# Patient Record
Sex: Female | Born: 1945 | Race: White | Hispanic: No | Marital: Married | Smoking: Former smoker
Health system: Southern US, Community
[De-identification: ages and names within clinical notes are randomized; demographics above are authoritative.]

## PROBLEM LIST (undated history)

## (undated) DIAGNOSIS — R42 Dizziness and giddiness: Secondary | ICD-10-CM

## (undated) DIAGNOSIS — T4145XA Adverse effect of unspecified anesthetic, initial encounter: Secondary | ICD-10-CM

## (undated) DIAGNOSIS — I4430 Unspecified atrioventricular block: Secondary | ICD-10-CM

## (undated) DIAGNOSIS — K5792 Diverticulitis of intestine, part unspecified, without perforation or abscess without bleeding: Secondary | ICD-10-CM

## (undated) DIAGNOSIS — R112 Nausea with vomiting, unspecified: Secondary | ICD-10-CM

## (undated) DIAGNOSIS — M1990 Unspecified osteoarthritis, unspecified site: Secondary | ICD-10-CM

## (undated) DIAGNOSIS — E041 Nontoxic single thyroid nodule: Secondary | ICD-10-CM

## (undated) DIAGNOSIS — Z9889 Other specified postprocedural states: Secondary | ICD-10-CM

## (undated) DIAGNOSIS — E785 Hyperlipidemia, unspecified: Secondary | ICD-10-CM

## (undated) DIAGNOSIS — T8859XA Other complications of anesthesia, initial encounter: Secondary | ICD-10-CM

## (undated) DIAGNOSIS — K219 Gastro-esophageal reflux disease without esophagitis: Secondary | ICD-10-CM

## (undated) HISTORY — PX: TONSILLECTOMY: SUR1361

## (undated) HISTORY — PX: FRACTURE SURGERY: SHX138

## (undated) HISTORY — PX: ADENOIDECTOMY: SUR15

## (undated) HISTORY — PX: OTHER SURGICAL HISTORY: SHX169

## (undated) HISTORY — PX: BREAST CYST ASPIRATION: SHX578

## (undated) HISTORY — PX: HERNIA REPAIR: SHX51

## (undated) HISTORY — PX: TONSILECTOMY, ADENOIDECTOMY, BILATERAL MYRINGOTOMY AND TUBES: SHX2538

## (undated) HISTORY — PX: CATARACT EXTRACTION, BILATERAL: SHX1313

## (undated) HISTORY — PX: COLONOSCOPY: SHX174

---

## 2004-09-16 ENCOUNTER — Ambulatory Visit: Payer: Self-pay | Admitting: Internal Medicine

## 2004-11-21 ENCOUNTER — Ambulatory Visit: Payer: Self-pay | Admitting: Gastroenterology

## 2005-08-02 ENCOUNTER — Emergency Department: Payer: Self-pay | Admitting: Emergency Medicine

## 2005-10-13 ENCOUNTER — Ambulatory Visit: Payer: Self-pay | Admitting: Internal Medicine

## 2006-10-19 ENCOUNTER — Ambulatory Visit: Payer: Self-pay | Admitting: Internal Medicine

## 2007-10-25 ENCOUNTER — Ambulatory Visit: Payer: Self-pay | Admitting: Internal Medicine

## 2008-10-26 ENCOUNTER — Ambulatory Visit: Payer: Self-pay | Admitting: Internal Medicine

## 2009-10-29 ENCOUNTER — Ambulatory Visit: Payer: Self-pay | Admitting: Internal Medicine

## 2010-11-18 ENCOUNTER — Ambulatory Visit: Payer: Self-pay | Admitting: Internal Medicine

## 2011-09-25 ENCOUNTER — Ambulatory Visit: Payer: Self-pay | Admitting: Internal Medicine

## 2011-11-19 ENCOUNTER — Ambulatory Visit: Payer: Self-pay | Admitting: Internal Medicine

## 2011-11-27 ENCOUNTER — Ambulatory Visit: Payer: Self-pay | Admitting: Gastroenterology

## 2012-11-21 ENCOUNTER — Ambulatory Visit: Payer: Self-pay | Admitting: Internal Medicine

## 2013-12-12 ENCOUNTER — Ambulatory Visit: Payer: Self-pay | Admitting: Internal Medicine

## 2014-07-13 ENCOUNTER — Other Ambulatory Visit: Payer: Self-pay | Admitting: Internal Medicine

## 2014-07-13 DIAGNOSIS — Z1231 Encounter for screening mammogram for malignant neoplasm of breast: Secondary | ICD-10-CM

## 2014-12-14 ENCOUNTER — Other Ambulatory Visit: Payer: Self-pay | Admitting: Internal Medicine

## 2014-12-14 ENCOUNTER — Ambulatory Visit
Admission: RE | Admit: 2014-12-14 | Discharge: 2014-12-14 | Disposition: A | Discharge status: Home / Self Care | Payer: Medicare Other | Source: Ambulatory Visit | Attending: Internal Medicine | Admitting: Internal Medicine

## 2014-12-14 DIAGNOSIS — Z1231 Encounter for screening mammogram for malignant neoplasm of breast: Secondary | ICD-10-CM

## 2015-09-25 ENCOUNTER — Other Ambulatory Visit: Payer: Self-pay | Admitting: Internal Medicine

## 2015-09-25 DIAGNOSIS — Z1231 Encounter for screening mammogram for malignant neoplasm of breast: Secondary | ICD-10-CM

## 2015-12-18 ENCOUNTER — Encounter
Admission: RE | Admit: 2015-12-18 | Discharge: 2015-12-18 | Disposition: A | Discharge status: Home / Self Care | Payer: Medicare Other | Source: Ambulatory Visit | Attending: Orthopedic Surgery | Admitting: Orthopedic Surgery

## 2015-12-18 HISTORY — DX: Gastro-esophageal reflux disease without esophagitis: K21.9

## 2015-12-18 HISTORY — DX: Other complications of anesthesia, initial encounter: T88.59XA

## 2015-12-18 HISTORY — DX: Unspecified osteoarthritis, unspecified site: M19.90

## 2015-12-18 HISTORY — DX: Adverse effect of unspecified anesthetic, initial encounter: T41.45XA

## 2015-12-18 MED ORDER — CEFAZOLIN SODIUM-DEXTROSE 2-4 GM/100ML-% IV SOLN
2.0000 g | Freq: Once | INTRAVENOUS | Status: AC
  Administered 2015-12-19: 2 g via INTRAVENOUS

## 2015-12-18 NOTE — Patient Instructions (Signed)
  Your procedure is scheduled on: 12-19-15 Report to Same Day Surgery 2nd floor medical mall @ 3:30 PER PT T  Remember: Instructions that are not followed completely may result in serious medical risk, up to and including death, or upon the discretion of your surgeon and anesthesiologist your surgery may need to be rescheduled.    _x___ 1. Do not eat food or drink liquids after midnight. No gum chewing or hard candies-PT STATES SHE WAS INSTRUCTED TO EAT A SMALL BREAKFAST (TOAST) AND CLEAR LIQUIDS AND HAVE IT ALL IN BY 7:30 AM     __x__ 2. No Alcohol for 24 hours before or after surgery.   __x__3. No Smoking for 24 prior to surgery.   ____  4. Bring all medications with you on the day of surgery if instructed.    __x__ 5. Notify your doctor if there is any change in your medical condition     (cold, fever, infections).     Do not wear jewelry, make-up, hairpins, clips or nail polish.  Do not wear lotions, powders, or perfumes. You may wear deodorant.  Do not shave 48 hours prior to surgery. Men may shave face and neck.  Do not bring valuables to the hospital.    [facility] is not responsible for any belongings or valuables.               Contacts, dentures or bridgework may not be worn into surgery.  Leave your suitcase in the car. After surgery it may be brought to your room.  For patients admitted to the hospital, discharge time is determined by your treatment team.   Patients discharged the day of surgery will not be allowed to drive home.    Please read over the following fact sheets that you were given:   [facility] Preparing for Surgery and or MRSA Information   _x___ Take these medicines the morning of surgery with A SIP OF WATER:    1. OMEPRAZOLE  2. TAKE AN OMEPRAZOLE TONIGHT BEFORE BED  3. MAY TAKE TYLENOL IF NEEDED  4.  5.  6.  ____Fleets enema or Magnesium Citrate as directed.   _x___ Use CHG Soap or sage wipes as directed on instruction sheet   ____ Use  inhalers on the day of surgery and bring to hospital day of surgery  ____ Stop metformin 2 days prior to surgery    ____ Take 1/2 of usual insulin dose the night before surgery and none on the morning of surgery.   _X___ Stop aspirin or coumadin, or plavix-LAST DOSE OF ASA WAS LUNCHTIME TODAY (12-18-15)  x__ Stop Anti-inflammatories such as Advil, Aleve, Ibuprofen, Motrin, Naproxen,          Naprosyn, Goodies powders or aspirin products. Ok to take Tylenol.   ____ Stop supplements until after surgery.    ____ Bring C-Pap to the hospital.

## 2015-12-19 ENCOUNTER — Ambulatory Visit: Payer: Medicare Other | Admitting: Certified Registered"

## 2015-12-19 ENCOUNTER — Ambulatory Visit: Payer: Medicare Other

## 2015-12-19 ENCOUNTER — Ambulatory Visit
Admission: RE | Admit: 2015-12-19 | Discharge: 2015-12-19 | Disposition: A | Discharge status: Home / Self Care | Payer: Medicare Other | Source: Ambulatory Visit | Attending: Orthopedic Surgery | Admitting: Orthopedic Surgery

## 2015-12-19 ENCOUNTER — Encounter: Payer: Self-pay | Admitting: *Deleted

## 2015-12-19 ENCOUNTER — Encounter
Admission: RE | Disposition: A | Discharge status: Home / Self Care | Payer: Self-pay | Source: Ambulatory Visit | Attending: Orthopedic Surgery

## 2015-12-19 DIAGNOSIS — M1990 Unspecified osteoarthritis, unspecified site: Secondary | ICD-10-CM | POA: Diagnosis not present

## 2015-12-19 DIAGNOSIS — Z87891 Personal history of nicotine dependence: Secondary | ICD-10-CM | POA: Diagnosis not present

## 2015-12-19 DIAGNOSIS — Z79899 Other long term (current) drug therapy: Secondary | ICD-10-CM | POA: Diagnosis not present

## 2015-12-19 DIAGNOSIS — S52611A Displaced fracture of right ulna styloid process, initial encounter for closed fracture: Secondary | ICD-10-CM | POA: Diagnosis not present

## 2015-12-19 DIAGNOSIS — S52501A Unspecified fracture of the lower end of right radius, initial encounter for closed fracture: Secondary | ICD-10-CM | POA: Diagnosis present

## 2015-12-19 DIAGNOSIS — Z823 Family history of stroke: Secondary | ICD-10-CM | POA: Insufficient documentation

## 2015-12-19 DIAGNOSIS — K219 Gastro-esophageal reflux disease without esophagitis: Secondary | ICD-10-CM | POA: Diagnosis not present

## 2015-12-19 DIAGNOSIS — E785 Hyperlipidemia, unspecified: Secondary | ICD-10-CM | POA: Diagnosis not present

## 2015-12-19 DIAGNOSIS — Z8781 Personal history of (healed) traumatic fracture: Secondary | ICD-10-CM

## 2015-12-19 DIAGNOSIS — Z800 Family history of malignant neoplasm of digestive organs: Secondary | ICD-10-CM | POA: Insufficient documentation

## 2015-12-19 DIAGNOSIS — W19XXXA Unspecified fall, initial encounter: Secondary | ICD-10-CM | POA: Insufficient documentation

## 2015-12-19 DIAGNOSIS — Z9889 Other specified postprocedural states: Secondary | ICD-10-CM

## 2015-12-19 HISTORY — PX: OPEN REDUCTION INTERNAL FIXATION (ORIF) DISTAL RADIAL FRACTURE: SHX5989

## 2015-12-19 SURGERY — OPEN REDUCTION INTERNAL FIXATION (ORIF) DISTAL RADIUS FRACTURE
Anesthesia: General | Site: Wrist | Laterality: Right | Wound class: Clean

## 2015-12-19 MED ORDER — LACTATED RINGERS IV SOLN
INTRAVENOUS | Status: DC
  Administered 2015-12-19: 16:00:00 via INTRAVENOUS

## 2015-12-19 MED ORDER — FENTANYL CITRATE (PF) 100 MCG/2ML IJ SOLN
INTRAMUSCULAR | Status: DC | PRN
  Administered 2015-12-19: 25 ug via INTRAVENOUS
  Administered 2015-12-19: 50 ug via INTRAVENOUS
  Administered 2015-12-19 (×3): 25 ug via INTRAVENOUS

## 2015-12-19 MED ORDER — CEFAZOLIN SODIUM-DEXTROSE 2-4 GM/100ML-% IV SOLN
INTRAVENOUS | Status: AC
  Filled 2015-12-19: qty 100

## 2015-12-19 MED ORDER — OXYCODONE HCL 5 MG PO TABS
5.0000 mg | ORAL_TABLET | Freq: Once | ORAL | Status: AC | PRN
  Administered 2015-12-19: 5 mg via ORAL

## 2015-12-19 MED ORDER — OXYCODONE HCL 5 MG PO TABS
ORAL_TABLET | ORAL | Status: AC
  Filled 2015-12-19: qty 1

## 2015-12-19 MED ORDER — PROPOFOL 10 MG/ML IV BOLUS
INTRAVENOUS | Status: DC | PRN
  Administered 2015-12-19: 150 mg via INTRAVENOUS

## 2015-12-19 MED ORDER — FENTANYL CITRATE (PF) 100 MCG/2ML IJ SOLN
25.0000 ug | INTRAMUSCULAR | Status: DC | PRN
  Administered 2015-12-19 (×3): 50 ug via INTRAVENOUS

## 2015-12-19 MED ORDER — EPHEDRINE SULFATE 50 MG/ML IJ SOLN
INTRAMUSCULAR | Status: DC | PRN
  Administered 2015-12-19: 10 mg via INTRAVENOUS

## 2015-12-19 MED ORDER — SCOPOLAMINE 1 MG/3DAYS TD PT72
1.0000 | MEDICATED_PATCH | TRANSDERMAL | Status: DC
  Administered 2015-12-19: 1.5 mg via TRANSDERMAL

## 2015-12-19 MED ORDER — NEOMYCIN-POLYMYXIN B GU 40-200000 IR SOLN
Status: AC
  Filled 2015-12-19: qty 2

## 2015-12-19 MED ORDER — GLYCOPYRROLATE 0.2 MG/ML IJ SOLN
INTRAMUSCULAR | Status: DC | PRN
  Administered 2015-12-19: 0.2 mg via INTRAVENOUS

## 2015-12-19 MED ORDER — FENTANYL CITRATE (PF) 100 MCG/2ML IJ SOLN
INTRAMUSCULAR | Status: AC
  Filled 2015-12-19: qty 2

## 2015-12-19 MED ORDER — NEOMYCIN-POLYMYXIN B GU 40-200000 IR SOLN
Status: DC | PRN
  Administered 2015-12-19: 2 mL

## 2015-12-19 MED ORDER — DEXAMETHASONE SODIUM PHOSPHATE 10 MG/ML IJ SOLN
INTRAMUSCULAR | Status: DC | PRN
  Administered 2015-12-19: 5 mg via INTRAVENOUS

## 2015-12-19 MED ORDER — LIDOCAINE HCL (PF) 2 % IJ SOLN
INTRAMUSCULAR | Status: DC | PRN
  Administered 2015-12-19: 50 mg

## 2015-12-19 MED ORDER — OXYCODONE HCL 5 MG/5ML PO SOLN: 5.0000 mg | Freq: Once | ORAL | Status: AC | PRN

## 2015-12-19 MED ORDER — ONDANSETRON HCL 4 MG/2ML IJ SOLN
INTRAMUSCULAR | Status: DC | PRN
  Administered 2015-12-19: 4 mg via INTRAVENOUS

## 2015-12-19 MED ORDER — OXYCODONE-ACETAMINOPHEN 5-325 MG PO TABS: 1.0000 | ORAL_TABLET | ORAL | 0 refills | Status: DC | PRN

## 2015-12-19 MED ORDER — SCOPOLAMINE 1 MG/3DAYS TD PT72
MEDICATED_PATCH | TRANSDERMAL | Status: AC
  Filled 2015-12-19: qty 1

## 2015-12-19 MED ORDER — MIDAZOLAM HCL 5 MG/5ML IJ SOLN
INTRAMUSCULAR | Status: DC | PRN
  Administered 2015-12-19: 2 mg via INTRAVENOUS

## 2015-12-19 SURGICAL SUPPLY — 43 items
BANDAGE ACE 4X5 VEL STRL LF (GAUZE/BANDAGES/DRESSINGS) ×3 IMPLANT
BIT DRILL 2 FAST STEP (BIT) ×3 IMPLANT
BIT DRILL 2.8X4 QC CORT (BIT) ×3 IMPLANT
CANISTER SUCT 1200ML W/VALVE (MISCELLANEOUS) ×3 IMPLANT
CHLORAPREP W/TINT 26ML (MISCELLANEOUS) ×3 IMPLANT
CUFF TOURN 18 STER (MISCELLANEOUS) IMPLANT
DRAPE FLUOR MINI C-ARM 54X84 (DRAPES) ×3 IMPLANT
ELECT REM PT RETURN 9FT ADLT (ELECTROSURGICAL) ×3
ELECTRODE REM PT RTRN 9FT ADLT (ELECTROSURGICAL) ×1 IMPLANT
GAUZE PETRO XEROFOAM 1X8 (MISCELLANEOUS) ×6 IMPLANT
GAUZE SPONGE 4X4 12PLY STRL (GAUZE/BANDAGES/DRESSINGS) ×3 IMPLANT
GLOVE BIOGEL PI IND STRL 9 (GLOVE) ×1 IMPLANT
GLOVE BIOGEL PI INDICATOR 9 (GLOVE) ×2
GLOVE SURG SYN 9.0  PF PI (GLOVE) ×2
GLOVE SURG SYN 9.0 PF PI (GLOVE) ×1 IMPLANT
GOWN SRG 2XL LVL 4 RGLN SLV (GOWNS) ×1 IMPLANT
GOWN STRL NON-REIN 2XL LVL4 (GOWNS) ×2
GOWN STRL REUS W/ TWL LRG LVL3 (GOWN DISPOSABLE) ×1 IMPLANT
GOWN STRL REUS W/TWL LRG LVL3 (GOWN DISPOSABLE) ×2
K-WIRE 1.6 (WIRE) ×2
K-WIRE FX5X1.6XNS BN SS (WIRE) ×1
KIT RM TURNOVER STRD PROC AR (KITS) ×3 IMPLANT
KWIRE FX5X1.6XNS BN SS (WIRE) ×1 IMPLANT
NEEDLE FILTER BLUNT 18X 1/2SAF (NEEDLE) ×2
NEEDLE FILTER BLUNT 18X1 1/2 (NEEDLE) ×1 IMPLANT
NS IRRIG 500ML POUR BTL (IV SOLUTION) ×3 IMPLANT
PACK EXTREMITY ARMC (MISCELLANEOUS) ×3 IMPLANT
PAD CAST CTTN 4X4 STRL (SOFTGOODS) ×2 IMPLANT
PADDING CAST COTTON 4X4 STRL (SOFTGOODS) ×4
PEG SUBCHONDRAL SMOOTH 2.0X16 (Peg) ×3 IMPLANT
PEG SUBCHONDRAL SMOOTH 2.0X18 (Peg) ×3 IMPLANT
PEG SUBCHONDRAL SMOOTH 2.0X22 (Peg) ×3 IMPLANT
PLATE SHORT 21.6X48.9 NRRW RT (Plate) ×3 IMPLANT
SCREW CORT 3.5X10 LNG (Screw) ×3 IMPLANT
SCREW MULTI DIRECT 18MM (Screw) ×3 IMPLANT
SCREW MULTI DIRECT 20MM (Screw) ×6 IMPLANT
SPLINT CAST 1 STEP 3X12 (MISCELLANEOUS) ×3 IMPLANT
STOCKINETTE STRL 4IN 9604848 (GAUZE/BANDAGES/DRESSINGS) ×3 IMPLANT
SUT ETHILON 4-0 (SUTURE) ×2
SUT ETHILON 4-0 FS2 18XMFL BLK (SUTURE) ×1
SUT VICRYL 3-0 27IN (SUTURE) ×3 IMPLANT
SUTURE ETHLN 4-0 FS2 18XMF BLK (SUTURE) ×1 IMPLANT
SYR 3ML LL SCALE MARK (SYRINGE) ×3 IMPLANT

## 2015-12-19 NOTE — OR Nursing (Signed)
EKG done preop and reviewed by Dr. Amie Critchley NPO status - toast/black coffee 7am  - Dr. Amie Critchley aware Scop patch applied as ordered

## 2015-12-19 NOTE — Transfer of Care (Signed)
Immediate Anesthesia Transfer of Care Note  Patient: Natalie Wood  Procedure(s) Performed: Procedure(s): OPEN REDUCTION INTERNAL FIXATION (ORIF) DISTAL RADIAL FRACTURE (Right)  Patient Location: PACU  Anesthesia Type:General  Level of Consciousness: awake, alert  and oriented  Airway & Oxygen Therapy: Patient Spontanous Breathing and Patient connected to face mask oxygen  Post-op Assessment: Report given to RN and Post -op Vital signs reviewed and stable  Post vital signs: Reviewed  Last Vitals:  Vitals:   12/19/15 1506 12/19/15 1733  BP: (!) 164/68 (!) 155/66  Pulse: 65 91  Resp: 18 14  Temp: 36.9 C (!) 35.9 C    Last Pain:  Vitals:   12/19/15 1506  TempSrc: Oral  PainSc: 3          Complications: No apparent anesthesia complications

## 2015-12-19 NOTE — H&P (Signed)
Reviewed paper H+P, will be scanned into chart. No changes noted.  

## 2015-12-19 NOTE — Anesthesia Procedure Notes (Signed)
Procedure Name: LMA Insertion Performed by: Rolla Plate Pre-anesthesia Checklist: Patient identified, Patient being monitored, Timeout performed, Emergency Drugs available and Suction available Patient Re-evaluated:Patient Re-evaluated prior to inductionOxygen Delivery Method: Circle system utilized Preoxygenation: Pre-oxygenation with 100% oxygen Intubation Type: IV induction LMA: LMA inserted LMA Size: 3.5 Tube type: Oral Number of attempts: 1 Placement Confirmation: positive ETCO2 and breath sounds checked- equal and bilateral Tube secured with: Tape Dental Injury: Teeth and Oropharynx as per pre-operative assessment

## 2015-12-19 NOTE — Anesthesia Preprocedure Evaluation (Addendum)
Anesthesia Evaluation  Patient identified by MRN, date of birth, ID band Patient awake    Reviewed: Allergy & Precautions, H&P , NPO status , Patient's Chart, lab work & pertinent test results  History of Anesthesia Complications (+) PONV, PROLONGED EMERGENCE and history of anesthetic complications  Airway Mallampati: III  TM Distance: <3 FB Neck ROM: limited    Dental  (+) Poor Dentition, Chipped   Pulmonary neg shortness of breath, former smoker,    Pulmonary exam normal breath sounds clear to auscultation       Cardiovascular Exercise Tolerance: Good (-) angina(-) Past MI and (-) DOE negative cardio ROS Normal cardiovascular exam Rhythm:regular Rate:Normal     Neuro/Psych negative neurological ROS  negative psych ROS   GI/Hepatic Neg liver ROS, GERD  Controlled,  Endo/Other  negative endocrine ROS  Renal/GU      Musculoskeletal  (+) Arthritis ,   Abdominal   Peds  Hematology negative hematology ROS (+)   Anesthesia Other Findings Signs and symptoms suggestive of sleep apnea   Past Medical History: No date: Anemia     Comment: H/O No date: Arthritis     Comment: LEFT KNEE No date: Complication of anesthesia     Comment: HARD TO WAKE UP AFTER A COLONOSCOPY-TENDS TO               GET NAUSEATED EASILY No date: GERD (gastroesophageal reflux disease)  Past Surgical History: No date: BREAST CYST ASPIRATION Bilateral     Comment: 35 years ago negative No date: COLONOSCOPY  BMI    Body Mass Index:  29.63 kg/m      Reproductive/Obstetrics negative OB ROS                             Anesthesia Physical Anesthesia Plan  ASA: III  Anesthesia Plan: General LMA   Post-op Pain Management:    Induction:   Airway Management Planned:   Additional Equipment:   Intra-op Plan:   Post-operative Plan:   Informed Consent: I have reviewed the patients History and Physical,  chart, labs and discussed the procedure including the risks, benefits and alternatives for the proposed anesthesia with the patient or authorized representative who has indicated his/her understanding and acceptance.   Dental Advisory Given  Plan Discussed with: Anesthesiologist, CRNA and Surgeon  Anesthesia Plan Comments:        Anesthesia Quick Evaluation

## 2015-12-19 NOTE — Anesthesia Postprocedure Evaluation (Signed)
Anesthesia Post Note  Patient: Natalie Wood  Procedure(s) Performed: Procedure(s) (LRB): OPEN REDUCTION INTERNAL FIXATION (ORIF) DISTAL RADIAL FRACTURE (Right)  Patient location during evaluation: PACU Anesthesia Type: General Level of consciousness: awake and alert and oriented Pain management: pain level controlled Vital Signs Assessment: post-procedure vital signs reviewed and stable Respiratory status: spontaneous breathing, nonlabored ventilation and respiratory function stable Cardiovascular status: blood pressure returned to baseline and stable Postop Assessment: no signs of nausea or vomiting Anesthetic complications: no    Last Vitals:  Vitals:   12/19/15 1734 12/19/15 1749  BP: (!) 155/66 (!) 147/68  Pulse: 84 78  Resp: 13 (!) 5  Temp: (!) 35.9 C     Last Pain:  Vitals:   12/19/15 1749  TempSrc:   PainSc: 3                  Ayani Ospina

## 2015-12-19 NOTE — Discharge Instructions (Signed)
Keep arm elevated is much as possible, keep splint and dressing clean and dry. Move fingers is much as possible. Pain medicine as directed   AMBULATORY SURGERY  DISCHARGE INSTRUCTIONS   1) The drugs that you were given will stay in your system until tomorrow so for the next 24 hours you should not:  A) Drive an automobile B) Make any legal decisions C) Drink any alcoholic beverage   2) You may resume regular meals tomorrow.  Today it is better to start with liquids and gradually work up to solid foods.  You may eat anything you prefer, but it is better to start with liquids, then soup and crackers, and gradually work up to solid foods.   3) Please notify your doctor immediately if you have any unusual bleeding, trouble breathing, redness and pain at the surgery site, drainage, fever, or pain not relieved by medication.    4) Additional Instructions:        Please contact your physician with any problems or Same Day Surgery at [phone], Monday through Friday 6 am to 4 pm, or [place] [facility] number at [phone].

## 2015-12-19 NOTE — Op Note (Signed)
12/19/2015  5:39 PM  PATIENT:  Natalie Wood  69 y.o. female  PRE-OPERATIVE DIAGNOSIS:  right distal radius fracture  POST-OPERATIVE DIAGNOSIS:  right distal radius fracture  PROCEDURE:  Procedure(s): OPEN REDUCTION INTERNAL FIXATION (ORIF) DISTAL RADIAL FRACTURE (Right)  SURGEON: Laurene Footman, MD  ASSISTANTS: None  ANESTHESIA:   general  EBL:  Total I/O In: 500 [I.V.:500] Out: -   BLOOD ADMINISTERED:none  DRAINS: none   LOCAL MEDICATIONS USED:  NONE  SPECIMEN:  No Specimen  DISPOSITION OF SPECIMEN:  N/A  COUNTS:  YES  TOURNIQUET:   27 minutes at 250 murmurs mercury  IMPLANTS: Short narrow DVR plate from [employer] with multiple screws and pegs  DICTATION: .Dragon Dictation patient brought to the operating room and after adequate general anesthesia was obtained the arm was prepped and draped in sterile fashion with a turned by the upper arm. After patient identification and timeout procedures were completed, the tourniquet was raised. A volar approach is made centered over the FCR tendon with the tendon retracted radially along the radial artery and vessels. The fingertrap traction was applied at the end of the table and this restored length. A freer elevator was used to get better alignment of the distal fragments with 2 intra-articular fragments being present these were reduced and with the wrist in flexion near anatomic alignment was obtained a volar plate was then pinned in place and the cortical screws were placed in the shaft with 310 mm screws placed. Distally threaded pegs were used and the proximal row and smooth pegs in the distal row to get good fixation of the distal radius with all threaded peg threaded peg smooth pegs and screws and traction was removed. Under fluoroscopic guidance the range of motion was noted and there was no motion at the fracture site. The wound was irrigated and tourniquet let down after tourniquet is let down the wound was  closed with simple interrupted 3-0 Vicryl and 4-0 nylon for the skin. Xeroform 4 x 4 web roll volar splint and Ace wrap applied and applied  PLAN OF CARE: Discharge to home after PACU  PATIENT DISPOSITION:  PACU - hemodynamically stable.

## 2015-12-20 ENCOUNTER — Encounter: Payer: Self-pay | Admitting: Orthopedic Surgery

## 2015-12-27 ENCOUNTER — Ambulatory Visit: Admission: RE | Admit: 2015-12-27 | Payer: Medicare Other | Source: Ambulatory Visit

## 2016-01-08 ENCOUNTER — Ambulatory Visit
Admission: RE | Admit: 2016-01-08 | Discharge: 2016-01-08 | Disposition: A | Discharge status: Home / Self Care | Payer: Medicare Other | Source: Ambulatory Visit | Attending: Internal Medicine | Admitting: Internal Medicine

## 2016-01-08 ENCOUNTER — Other Ambulatory Visit: Payer: Self-pay | Admitting: Internal Medicine

## 2016-01-08 DIAGNOSIS — Z1231 Encounter for screening mammogram for malignant neoplasm of breast: Secondary | ICD-10-CM | POA: Diagnosis not present

## 2016-01-17 ENCOUNTER — Other Ambulatory Visit: Payer: Self-pay | Admitting: Family Medicine

## 2016-01-17 MED ORDER — OXYCODONE-ACETAMINOPHEN 5-325 MG PO TABS: 1.0000 | ORAL_TABLET | ORAL | 0 refills | Status: DC | PRN

## 2016-01-17 NOTE — Telephone Encounter (Signed)
Pt needs refill on her hydrocodone.  Please call when ready.  Thanks Con Memos

## 2016-01-20 ENCOUNTER — Other Ambulatory Visit: Payer: Self-pay | Admitting: Family Medicine

## 2016-10-07 ENCOUNTER — Other Ambulatory Visit: Payer: Self-pay | Admitting: Internal Medicine

## 2016-10-07 DIAGNOSIS — Z1231 Encounter for screening mammogram for malignant neoplasm of breast: Secondary | ICD-10-CM

## 2017-01-08 ENCOUNTER — Ambulatory Visit
Admission: RE | Admit: 2017-01-08 | Discharge: 2017-01-08 | Disposition: A | Discharge status: Home / Self Care | Payer: Medicare Other | Source: Ambulatory Visit | Attending: Internal Medicine | Admitting: Internal Medicine

## 2017-01-08 DIAGNOSIS — Z1231 Encounter for screening mammogram for malignant neoplasm of breast: Secondary | ICD-10-CM | POA: Insufficient documentation

## 2017-12-08 ENCOUNTER — Other Ambulatory Visit: Payer: Self-pay | Admitting: Internal Medicine

## 2017-12-08 DIAGNOSIS — Z1231 Encounter for screening mammogram for malignant neoplasm of breast: Secondary | ICD-10-CM

## 2018-01-05 ENCOUNTER — Ambulatory Visit
Admission: RE | Admit: 2018-01-05 | Discharge: 2018-01-05 | Disposition: A | Discharge status: Home / Self Care | Payer: Medicare Other | Source: Ambulatory Visit | Attending: Internal Medicine | Admitting: Internal Medicine

## 2018-01-05 ENCOUNTER — Other Ambulatory Visit: Payer: Self-pay | Admitting: Internal Medicine

## 2018-01-05 DIAGNOSIS — K4090 Unilateral inguinal hernia, without obstruction or gangrene, not specified as recurrent: Secondary | ICD-10-CM | POA: Insufficient documentation

## 2018-01-05 DIAGNOSIS — K760 Fatty (change of) liver, not elsewhere classified: Secondary | ICD-10-CM | POA: Diagnosis not present

## 2018-01-05 DIAGNOSIS — K5730 Diverticulosis of large intestine without perforation or abscess without bleeding: Secondary | ICD-10-CM | POA: Diagnosis not present

## 2018-01-05 DIAGNOSIS — R1032 Left lower quadrant pain: Secondary | ICD-10-CM

## 2018-01-05 MED ORDER — IOPAMIDOL (ISOVUE-300) INJECTION 61%
100.0000 mL | Freq: Once | INTRAVENOUS | Status: AC | PRN
  Administered 2018-01-05: 100 mL via INTRAVENOUS

## 2018-01-10 ENCOUNTER — Ambulatory Visit
Admission: RE | Admit: 2018-01-10 | Discharge: 2018-01-10 | Disposition: A | Discharge status: Home / Self Care | Payer: Medicare Other | Source: Ambulatory Visit | Attending: Internal Medicine | Admitting: Internal Medicine

## 2018-01-10 DIAGNOSIS — Z1231 Encounter for screening mammogram for malignant neoplasm of breast: Secondary | ICD-10-CM | POA: Diagnosis not present

## 2018-02-03 ENCOUNTER — Encounter: Payer: Self-pay | Admitting: General Surgery

## 2018-02-03 ENCOUNTER — Ambulatory Visit: Payer: Medicare Other | Admitting: General Surgery

## 2018-02-03 VITALS — BP 153/90 | HR 86 | Temp 97.5°F | Resp 13 | Ht 62.0 in | Wt 176.6 lb

## 2018-02-03 DIAGNOSIS — K4090 Unilateral inguinal hernia, without obstruction or gangrene, not specified as recurrent: Secondary | ICD-10-CM

## 2018-02-03 NOTE — Progress Notes (Signed)
Patient ID: Natalie Wood, female   DOB: Nov 18, 1945, 72 y.o.   MRN: [id number]  Chief Complaint  Patient presents with  . New Patient (Initial Visit)     new pt ref Dr.Mark Sabra Heck left ingunial hernia mailed nppw    HPI Natalie Wood is a 72 y.o. female here today as a new pt ref Dr.Mark Sabra Heck left ingunial hernia.  She has had discomfort in this area  for over 1 year but was unaware of a bulge or mass-effect until the last 3-4 months.  She does have some constipation. Patients states she has pain only when she has been on her feet and doing a fair amount of work.  Accompanied today by her husband of 40 years, Doren Custard.    HPI  Past Medical History:  Diagnosis Date  . Arthritis    LEFT KNEE  . Complication of anesthesia    HARD TO WAKE UP AFTER A COLONOSCOPY-TENDS TO GET NAUSEATED EASILY  . GERD (gastroesophageal reflux disease)     Past Surgical History:  Procedure Laterality Date  . BREAST CYST ASPIRATION Bilateral    35 years ago negative  . COLONOSCOPY    . OPEN REDUCTION INTERNAL FIXATION (ORIF) DISTAL RADIAL FRACTURE Right 12/19/2015   Procedure: OPEN REDUCTION INTERNAL FIXATION (ORIF) DISTAL RADIAL FRACTURE;  Surgeon: Hessie Knows, MD;  Location: [facility] ORS;  Service: Orthopedics;  Laterality: Right;  . TONSILECTOMY, ADENOIDECTOMY, BILATERAL MYRINGOTOMY AND TUBES      Family History  Problem Relation Age of Onset  . Stroke Mother   . Cancer Father   . Breast cancer Neg Hx     Social History Social History   Tobacco Use  . Smoking status: Former Smoker    Packs/day: 0.25    Years: 20.00    Pack years: 5.00    Types: Cigarettes    Last attempt to quit: 12/18/2003    Years since quitting: 14.1  . Smokeless tobacco: Never Used  Substance Use Topics  . Alcohol use: Yes    Comment: OCC WINE  . Drug use: No    Allergies  Allergen Reactions  . Hydrocodone Nausea And Vomiting    Current Outpatient Medications  Medication Sig Dispense Refill  . acetaminophen  (TYLENOL) 325 MG tablet Take 650 mg by mouth every 6 (six) hours as needed.    Marland Kitchen aspirin 81 MG chewable tablet Chew 81 mg by mouth daily.    . Calcium Carbonate-Vitamin D (CALCIUM 500 + D) 500-125 MG-UNIT TABS Take 1 tablet by mouth daily.    . Cholecalciferol (VITAMIN D-1000 MAX ST) 25 MCG (1000 UT) tablet Take by mouth.    . Cyanocobalamin (VITAMIN B-12) 2500 MCG SUBL Place under the tongue.    . docusate sodium (COLACE) 100 MG capsule Take by mouth.    . loratadine (CLARITIN) 10 MG tablet Take by mouth.    . lovastatin (MEVACOR) 40 MG tablet Take 40 mg by mouth at bedtime.    . meclizine (ANTIVERT) 25 MG tablet TAKE 1 TABLET BY MOUTH EVERY 4 HOURS IF NEEDED FOR DIZZINESS    . Multiple Vitamin (MULTIVITAMIN) tablet Take 1 tablet by mouth daily.    Marland Kitchen omeprazole (PRILOSEC) 20 MG capsule Take 20 mg by mouth as needed.    . zolpidem (AMBIEN) 10 MG tablet Take 10 mg by mouth at bedtime as needed for sleep.     No current facility-administered medications for this visit.     Review of Systems Review of Systems  Constitutional: Negative.   Respiratory: Negative.   Cardiovascular: Negative.     Blood pressure (!) 153/90, pulse 86, temperature (!) 97.5 F (36.4 C), temperature source Temporal, resp. rate 13, height 5\' 2"  (1.575 m), weight 176 lb 9.6 oz (80.1 kg), SpO2 96 %.  Physical Exam Physical Exam  Constitutional: She is oriented to person, place, and time. She appears well-developed and well-nourished.  HENT:  Head: Normocephalic and atraumatic.  Eyes: Conjunctivae are normal. No scleral icterus.  Neck: Neck supple.  Cardiovascular: Normal rate and regular rhythm.  Pulmonary/Chest: Effort normal and breath sounds normal.  Abdominal:    Lymphadenopathy:    She has no cervical adenopathy.       Right: No inguinal adenopathy present.       Left: No inguinal adenopathy present.  Neurological: She is alert and oriented to person, place, and time.  Skin: Skin is warm and dry.     Data Reviewed January 05, 2018 CT: IMPRESSION: Increased size of small to moderate left inguinal hernia, which now contains a loop of sigmoid colon. No evidence of bowel obstruction or ischemia.  Colonic diverticulosis, without radiographic evidence of diverticulitis.  Mild hepatic steatosis.  CBC of January 05, 2018 showed a hemoglobin of 13.2 with an MCV of 93.6.  White blood cell count of 8500.  Platelet count not reported due to clumping.  Comprehensive metabolic panel of the same date was normal.  Creatinine 0.7 with an estimated GFR of 82.  Normal electrolytes.  Normal liver function studies.  PCP notes of January 05, 2013.  Assessment    Symptomatic left inguinal hernia.    Plan  Hernia precautions and incarceration were discussed with the patient. If they develop symptoms of an incarcerated hernia, they were encouraged to seek prompt medical attention.  I have recommended repair of the hernia using mesh on an outpatient basis in the near future. The risk of infection was reviewed. The role of prosthetic mesh to minimize the risk of recurrence was reviewed.   Patient needs to be scheduled for surgery with Dr.Lorian Yaun for inguinal hernia.  HPI, Physical Exam, Assessment and Plan have been scribed under the direction and in the presence of Hervey Ard, Md.  Eudelia Bunch R. Bobette Mo, CMA  I have completed the exam and reviewed the above documentation for accuracy and completeness.  I agree with the above.  Haematologist has been used and any errors in dictation or transcription are unintentional.  Hervey Ard, M.D., F.A.C.S.  Natalie Wood 02/03/2018, 3:23 PM  Patient's surgery has been scheduled for 02-14-18 at [facility] with Dr. Bary Castilla. It is okay for patient to continue an 81 mg aspirin once daily.   The patient is aware to call the office should they have further questions.   Dominga Ferry, CMA

## 2018-02-03 NOTE — Patient Instructions (Signed)
Patient needs to be scheduled for surgery with Dr.Byrnett for inguinal hernia.

## 2018-02-04 DIAGNOSIS — K4090 Unilateral inguinal hernia, without obstruction or gangrene, not specified as recurrent: Secondary | ICD-10-CM | POA: Insufficient documentation

## 2018-02-08 ENCOUNTER — Other Ambulatory Visit: Payer: Self-pay

## 2018-02-08 ENCOUNTER — Encounter
Admission: RE | Admit: 2018-02-08 | Discharge: 2018-02-08 | Disposition: A | Discharge status: Home / Self Care | Payer: Medicare Other | Source: Ambulatory Visit | Attending: General Surgery | Admitting: General Surgery

## 2018-02-08 HISTORY — DX: Other specified postprocedural states: Z98.890

## 2018-02-08 HISTORY — DX: Nausea with vomiting, unspecified: R11.2

## 2018-02-08 NOTE — Patient Instructions (Signed)
Your procedure is scheduled on: 02-14-18  Report to Same Day Surgery 2nd floor medical mall [facility] Entrance-take elevator on left to 2nd floor.  Check in with surgery information desk.) To find out your arrival time please call [phone] between 1PM - 3PM on 02-11-18  Remember: Instructions that are not followed completely may result in serious medical risk, up to and including death, or upon the discretion of your surgeon and anesthesiologist your surgery may need to be rescheduled.    _x___ 1. Do not eat food after midnight the night before your procedure. You may drink clear liquids up to 2 hours before you are scheduled to arrive at the hospital for your procedure.  Do not drink clear liquids within 2 hours of your scheduled arrival to the hospital.  Clear liquids include  --Water or Apple juice without pulp  --Clear carbohydrate beverage such as ClearFast or Gatorade  --Black Coffee or Clear Tea (No milk, no creamers, do not add anything to the coffee or Tea   ____Ensure clear carbohydrate drink on the way to the hospital for bariatric patients  ____Ensure clear carbohydrate drink 3 hours before surgery for Dr Dwyane Luo patients if physician instructed.    __x__ 2. No Alcohol for 24 hours before or after surgery.   __x__3. No Smoking or e-cigarettes for 24 prior to surgery.  Do not use any chewable tobacco products for at least 6 hour prior to surgery   ____  4. Bring all medications with you on the day of surgery if instructed.    __x__ 5. Notify your doctor if there is any change in your medical condition     (cold, fever, infections).    x___6. On the morning of surgery brush your teeth with toothpaste and water.  You may rinse your mouth with mouth wash if you wish.  Do not swallow any toothpaste or mouthwash.   Do not wear jewelry, make-up, hairpins, clips or nail polish.  Do not wear lotions, powders, or perfumes. You may wear deodorant.  Do not shave 48 hours  prior to surgery. Men may shave face and neck.  Do not bring valuables to the hospital.    [facility] is not responsible for any belongings or valuables.               Contacts, dentures or bridgework may not be worn into surgery.  Leave your suitcase in the car. After surgery it may be brought to your room.  For patients admitted to the hospital, discharge time is determined by your treatment team.  _  Patients discharged the day of surgery will not be allowed to drive home.  You will need someone to drive you home and stay with you the night of your procedure.    Please read over the following fact sheets that you were given:   [facility] Preparing for Surgery   _x___ TAKE THE FOLLOWING MEDICATION THE MORNING OF SURGERY WITH A SMALL SIP OF WATER. These include:  1. PRILOSEC  2. TAKE A PRILOSEC THE NIGHT BEFORE SURGERY  3.  4.  5.  6.  ____Fleets enema or Magnesium Citrate as directed.   _x___ Use CHG Soap or sage wipes as directed on instruction sheet   ____ Use inhalers on the day of surgery and bring to hospital day of surgery  ____ Stop Metformin and Janumet 2 days prior to surgery.    ____ Take 1/2 of usual insulin dose the night before surgery and  none on the morning surgery.   _x___ Follow recommendations from Cardiologist, Pulmonologist or PCP regarding stopping Aspirin, Coumadin, Plavix ,Eliquis, Effient, or Pradaxa, and Pletal-OK TO CONTINUE 81 MG ASA-DO NOT TAKE DAY OF SURGERY  ____Stop Anti-inflammatories such as Advil, Aleve, Ibuprofen, Motrin, Naproxen, Naprosyn, Goodies powders or aspirin products NOW-OK to take Tylenol    ___ Stop supplements until after surgery.     ____ Bring C-Pap to the hospital.

## 2018-02-13 MED ORDER — CEFAZOLIN SODIUM-DEXTROSE 2-4 GM/100ML-% IV SOLN
2.0000 g | INTRAVENOUS | Status: AC
  Administered 2018-02-14: 2 g via INTRAVENOUS

## 2018-02-14 ENCOUNTER — Ambulatory Visit: Payer: Medicare Other | Admitting: Anesthesiology

## 2018-02-14 ENCOUNTER — Ambulatory Visit
Admission: RE | Admit: 2018-02-14 | Discharge: 2018-02-14 | Disposition: A | Discharge status: Home / Self Care | Payer: Medicare Other | Source: Ambulatory Visit | Attending: General Surgery | Admitting: General Surgery

## 2018-02-14 ENCOUNTER — Encounter
Admission: RE | Disposition: A | Discharge status: Home / Self Care | Payer: Self-pay | Source: Ambulatory Visit | Attending: General Surgery

## 2018-02-14 DIAGNOSIS — Z87891 Personal history of nicotine dependence: Secondary | ICD-10-CM | POA: Diagnosis not present

## 2018-02-14 DIAGNOSIS — Z7982 Long term (current) use of aspirin: Secondary | ICD-10-CM | POA: Insufficient documentation

## 2018-02-14 DIAGNOSIS — Z79899 Other long term (current) drug therapy: Secondary | ICD-10-CM | POA: Insufficient documentation

## 2018-02-14 DIAGNOSIS — K219 Gastro-esophageal reflux disease without esophagitis: Secondary | ICD-10-CM | POA: Diagnosis not present

## 2018-02-14 DIAGNOSIS — K4090 Unilateral inguinal hernia, without obstruction or gangrene, not specified as recurrent: Secondary | ICD-10-CM

## 2018-02-14 DIAGNOSIS — Z6832 Body mass index (BMI) 32.0-32.9, adult: Secondary | ICD-10-CM | POA: Diagnosis not present

## 2018-02-14 DIAGNOSIS — Z888 Allergy status to other drugs, medicaments and biological substances status: Secondary | ICD-10-CM | POA: Insufficient documentation

## 2018-02-14 DIAGNOSIS — E669 Obesity, unspecified: Secondary | ICD-10-CM | POA: Diagnosis not present

## 2018-02-14 DIAGNOSIS — Z809 Family history of malignant neoplasm, unspecified: Secondary | ICD-10-CM | POA: Diagnosis not present

## 2018-02-14 DIAGNOSIS — Z823 Family history of stroke: Secondary | ICD-10-CM | POA: Insufficient documentation

## 2018-02-14 HISTORY — PX: INGUINAL HERNIA REPAIR: SHX194

## 2018-02-14 SURGERY — REPAIR, HERNIA, INGUINAL, ADULT
Anesthesia: General | Laterality: Left

## 2018-02-14 MED ORDER — PROMETHAZINE HCL 25 MG/ML IJ SOLN: 6.2500 mg | INTRAMUSCULAR | Status: DC | PRN

## 2018-02-14 MED ORDER — MEPERIDINE HCL 50 MG/ML IJ SOLN: 6.2500 mg | INTRAMUSCULAR | Status: DC | PRN

## 2018-02-14 MED ORDER — GABAPENTIN 300 MG PO CAPS
300.0000 mg | ORAL_CAPSULE | ORAL | Status: AC
  Administered 2018-02-14: 300 mg via ORAL

## 2018-02-14 MED ORDER — PHENYLEPHRINE HCL 10 MG/ML IJ SOLN
INTRAMUSCULAR | Status: DC | PRN
  Administered 2018-02-14: 50 ug via INTRAVENOUS
  Administered 2018-02-14: 150 ug via INTRAVENOUS

## 2018-02-14 MED ORDER — ACETAMINOPHEN 10 MG/ML IV SOLN
INTRAVENOUS | Status: DC | PRN
  Administered 2018-02-14: 1000 mg via INTRAVENOUS

## 2018-02-14 MED ORDER — KETOROLAC TROMETHAMINE 30 MG/ML IJ SOLN
INTRAMUSCULAR | Status: AC
  Filled 2018-02-14: qty 3

## 2018-02-14 MED ORDER — PROPOFOL 10 MG/ML IV BOLUS
INTRAVENOUS | Status: DC | PRN
  Administered 2018-02-14: 30 mg via INTRAVENOUS
  Administered 2018-02-14: 150 mg via INTRAVENOUS

## 2018-02-14 MED ORDER — CEFAZOLIN SODIUM-DEXTROSE 2-4 GM/100ML-% IV SOLN
INTRAVENOUS | Status: AC
  Filled 2018-02-14: qty 100

## 2018-02-14 MED ORDER — CELECOXIB 200 MG PO CAPS
200.0000 mg | ORAL_CAPSULE | ORAL | Status: AC
  Administered 2018-02-14: 200 mg via ORAL

## 2018-02-14 MED ORDER — LACTATED RINGERS IV SOLN
INTRAVENOUS | Status: DC | PRN
  Administered 2018-02-14: 14:00:00 via INTRAVENOUS

## 2018-02-14 MED ORDER — TRAMADOL HCL 50 MG PO TABS: 50.0000 mg | ORAL_TABLET | ORAL | 0 refills | Status: DC | PRN

## 2018-02-14 MED ORDER — ACETAMINOPHEN 10 MG/ML IV SOLN
INTRAVENOUS | Status: AC
  Filled 2018-02-14: qty 100

## 2018-02-14 MED ORDER — MIDAZOLAM HCL 2 MG/2ML IJ SOLN
INTRAMUSCULAR | Status: AC
  Filled 2018-02-14: qty 2

## 2018-02-14 MED ORDER — FENTANYL CITRATE (PF) 100 MCG/2ML IJ SOLN
INTRAMUSCULAR | Status: AC
  Filled 2018-02-14: qty 2

## 2018-02-14 MED ORDER — ROCURONIUM BROMIDE 50 MG/5ML IV SOLN
INTRAVENOUS | Status: AC
  Filled 2018-02-14: qty 1

## 2018-02-14 MED ORDER — FENTANYL CITRATE (PF) 100 MCG/2ML IJ SOLN: 25.0000 ug | INTRAMUSCULAR | Status: DC | PRN

## 2018-02-14 MED ORDER — LACTATED RINGERS IV SOLN
INTRAVENOUS | Status: DC
  Administered 2018-02-14: 13:00:00 via INTRAVENOUS

## 2018-02-14 MED ORDER — FENTANYL CITRATE (PF) 100 MCG/2ML IJ SOLN
INTRAMUSCULAR | Status: DC | PRN
  Administered 2018-02-14 (×2): 25 ug via INTRAVENOUS
  Administered 2018-02-14: 50 ug via INTRAVENOUS

## 2018-02-14 MED ORDER — BUPIVACAINE-EPINEPHRINE (PF) 0.5% -1:200000 IJ SOLN
INTRAMUSCULAR | Status: DC | PRN
  Administered 2018-02-14: 25 mL

## 2018-02-14 MED ORDER — DEXAMETHASONE SODIUM PHOSPHATE 10 MG/ML IJ SOLN
INTRAMUSCULAR | Status: DC | PRN
  Administered 2018-02-14: 10 mg via INTRAVENOUS

## 2018-02-14 MED ORDER — MIDAZOLAM HCL 2 MG/2ML IJ SOLN
INTRAMUSCULAR | Status: DC | PRN
  Administered 2018-02-14: 2 mg via INTRAVENOUS

## 2018-02-14 MED ORDER — PROPOFOL 10 MG/ML IV BOLUS
INTRAVENOUS | Status: AC
  Filled 2018-02-14: qty 20

## 2018-02-14 MED ORDER — ONDANSETRON HCL 4 MG/2ML IJ SOLN
INTRAMUSCULAR | Status: DC | PRN
  Administered 2018-02-14: 4 mg via INTRAVENOUS

## 2018-02-14 MED ORDER — BUPIVACAINE-EPINEPHRINE (PF) 0.5% -1:200000 IJ SOLN
INTRAMUSCULAR | Status: AC
  Filled 2018-02-14: qty 30

## 2018-02-14 MED ORDER — LIDOCAINE HCL (CARDIAC) PF 100 MG/5ML IV SOSY
PREFILLED_SYRINGE | INTRAVENOUS | Status: DC | PRN
  Administered 2018-02-14: 100 mg via INTRAVENOUS

## 2018-02-14 MED ORDER — CELECOXIB 200 MG PO CAPS
ORAL_CAPSULE | ORAL | Status: AC
  Administered 2018-02-14: 200 mg via ORAL
  Filled 2018-02-14: qty 1

## 2018-02-14 MED ORDER — GABAPENTIN 300 MG PO CAPS
ORAL_CAPSULE | ORAL | Status: AC
  Filled 2018-02-14: qty 1

## 2018-02-14 SURGICAL SUPPLY — 35 items
BLADE SURG 15 STRL SS SAFETY (BLADE) ×6 IMPLANT
CANISTER SUCT 1200ML W/VALVE (MISCELLANEOUS) ×3 IMPLANT
CHLORAPREP W/TINT 26ML (MISCELLANEOUS) ×3 IMPLANT
CLOSURE WOUND 1/2 X4 (GAUZE/BANDAGES/DRESSINGS) ×1
COVER WAND RF STERILE (DRAPES) IMPLANT
DECANTER SPIKE VIAL GLASS SM (MISCELLANEOUS) IMPLANT
DRAIN PENROSE 1/4X12 LTX (DRAIN) ×3 IMPLANT
DRAPE LAPAROTOMY 100X77 ABD (DRAPES) ×3 IMPLANT
DRSG TEGADERM 4X4.75 (GAUZE/BANDAGES/DRESSINGS) ×3 IMPLANT
DRSG TELFA 4X3 1S NADH ST (GAUZE/BANDAGES/DRESSINGS) ×3 IMPLANT
ELECT REM PT RETURN 9FT ADLT (ELECTROSURGICAL) ×3
ELECTRODE REM PT RTRN 9FT ADLT (ELECTROSURGICAL) ×1 IMPLANT
GLOVE BIO SURGEON STRL SZ7.5 (GLOVE) ×9 IMPLANT
GLOVE INDICATOR 8.0 STRL GRN (GLOVE) ×9 IMPLANT
GOWN STRL REUS W/ TWL LRG LVL3 (GOWN DISPOSABLE) ×3 IMPLANT
GOWN STRL REUS W/TWL LRG LVL3 (GOWN DISPOSABLE) ×6
KIT TURNOVER KIT A (KITS) ×3 IMPLANT
LABEL OR SOLS (LABEL) ×3 IMPLANT
MESH HERNIA 6X12 ULTRAPRO MED (Mesh General) ×1 IMPLANT
MESH HERNIA ULTRAPRO MED (Mesh General) ×2 IMPLANT
NEEDLE HYPO 22GX1.5 SAFETY (NEEDLE) ×6 IMPLANT
PACK BASIN MINOR ARMC (MISCELLANEOUS) ×3 IMPLANT
STRIP CLOSURE SKIN 1/2X4 (GAUZE/BANDAGES/DRESSINGS) ×2 IMPLANT
SUT PDS AB 0 CT1 27 (SUTURE) ×3 IMPLANT
SUT SURGILON 0 BLK (SUTURE) ×3 IMPLANT
SUT VIC AB 2-0 SH 27 (SUTURE) ×2
SUT VIC AB 2-0 SH 27XBRD (SUTURE) ×1 IMPLANT
SUT VIC AB 3-0 54X BRD REEL (SUTURE) ×1 IMPLANT
SUT VIC AB 3-0 BRD 54 (SUTURE) ×2
SUT VIC AB 3-0 SH 27 (SUTURE) ×2
SUT VIC AB 3-0 SH 27X BRD (SUTURE) ×1 IMPLANT
SUT VIC AB 4-0 FS2 27 (SUTURE) ×3 IMPLANT
SWABSTK COMLB BENZOIN TINCTURE (MISCELLANEOUS) ×3 IMPLANT
SYR 10ML LL (SYRINGE) ×3 IMPLANT
SYR 3ML LL SCALE MARK (SYRINGE) ×3 IMPLANT

## 2018-02-14 NOTE — H&P (Signed)
72 year old woman with reducible left inguinal hernia for elective repair.  No change in clinical history or exam.

## 2018-02-14 NOTE — Transfer of Care (Signed)
Immediate Anesthesia Transfer of Care Note  Patient: Adryan E Stucky  Procedure(s) Performed: HERNIA REPAIR INGUINAL ADULT (Left )  Patient Location: PACU  Anesthesia Type:General  Level of Consciousness: awake and responds to stimulation  Airway & Oxygen Therapy: Patient Spontanous Breathing and Patient connected to face mask oxygen  Post-op Assessment: Report given to RN and Post -op Vital signs reviewed and stable  Post vital signs: Reviewed and stable  Last Vitals:  Vitals Value Taken Time  BP 120/64 02/14/2018  3:09 PM  Temp    Pulse 86 02/14/2018  3:09 PM  Resp 13 02/14/2018  3:09 PM  SpO2 100 % 02/14/2018  3:09 PM    Last Pain:  Vitals:   02/14/18 1309  TempSrc: Temporal  PainSc: 4          Complications: No apparent anesthesia complications

## 2018-02-14 NOTE — Op Note (Signed)
Preoperative diagnosis: Left inguinal hernia.  Postoperative diagnosis: Same.  Operative procedure: Repair of left direct inguinal hernia with medium Ultra Pro mesh.  Operating Surgeon: Hervey Ard, MD.  Assistant: Arvilla Meres, RNFA.  Anesthesia: General by LMA, Marcaine 0.5% with 1 to 200,000 units of epinephrine, 25 cc, Toradol 30 mg.  Estimated blood loss: Less than 5 cc.  Clinical note: This 72 year old woman is developed a symptom attic left inguinal hernia.  She presented for elective repair.  She received Kefzol prior to the procedure.  SCD stockings for DVT prevention.  Operative note: With the patient under general anesthesia the hernia was manually reduced with mild difficulty.  The abdomen and groin were then cleansed with ChloraPrep and draped.  Field block anesthesia was placed for postoperative analgesia.  A 5 cm skin line incision along the anticipated course the inguinal canal was made carried out the skin and generous layer of adipose tissue.  Hemostasis was electrocautery and 3-0 Vicryl ties.  The external Bleich was cleared and the external inguinal ring identified.  The external Bleich was opened in the direction of its fibers.  A large fatty mass was protruding medial to the inferior epigastric vessels consistent with a direct hernia.  The round ligament was divided to provide better exposure.  The internal ring was exposed circumferentially and the hernia contents reduced.  The preperitoneal space was cleared.  There is no evidence of a femoral or indirect hernia.  A medium ultra Pro mesh was smoothed into the preperitoneal space.  The external component was laid along the floor the inguinal canal.  This was anchored to the pubic tubercle and then along the inguinal ligament with interrupted 0 Surgilon sutures.  The medial and superior borders were anchored to the transverse abdominis aponeurosis in a similar fashion.  The area showed good hemostasis.  Toradol was placed in  the wound.  The external oblique was closed with a running 2-0 Vicryl suture.  Scarpa's fascia was closed with a running 3-0 Vicryl suture.  The skin was closed with a running 4-0 Vicryl subcuticular suture.  Benzoin and Steri-Strips followed by Telfa and Tegaderm dressings were applied.  Patient tolerated the procedure well was taken to recovery room in stable condition.

## 2018-02-14 NOTE — Discharge Instructions (Signed)

## 2018-02-14 NOTE — Anesthesia Preprocedure Evaluation (Signed)
Anesthesia Evaluation  Patient identified by MRN, date of birth, ID band Patient awake    Reviewed: Allergy & Precautions, NPO status , Patient's Chart, lab work & pertinent test results  History of Anesthesia Complications (+) PONV and history of anesthetic complications  Airway Mallampati: III  TM Distance: >3 FB Neck ROM: Full    Dental no notable dental hx.    Pulmonary neg sleep apnea, neg COPD, former smoker,    breath sounds clear to auscultation- rhonchi (-) wheezing      Cardiovascular Exercise Tolerance: Good (-) hypertension(-) CAD, (-) Past MI, (-) Cardiac Stents and (-) CABG  Rhythm:Regular Rate:Normal - Systolic murmurs and - Diastolic murmurs    Neuro/Psych negative neurological ROS  negative psych ROS   GI/Hepatic Neg liver ROS, GERD  ,  Endo/Other  negative endocrine ROSneg diabetes  Renal/GU negative Renal ROS     Musculoskeletal  (+) Arthritis ,   Abdominal (+) + obese,   Peds  Hematology negative hematology ROS (+)   Anesthesia Other Findings Past Medical History: No date: Arthritis     Comment:  LEFT KNEE No date: Complication of anesthesia     Comment:  HARD TO WAKE UP AFTER A COLONOSCOPY-TENDS TO GET               NAUSEATED EASILY No date: GERD (gastroesophageal reflux disease) No date: PONV (postoperative nausea and vomiting)   Reproductive/Obstetrics                             Anesthesia Physical Anesthesia Plan  ASA: II  Anesthesia Plan: General   Post-op Pain Management:    Induction: Intravenous  PONV Risk Score and Plan: 3 and Ondansetron, Dexamethasone and Midazolam  Airway Management Planned: LMA  Additional Equipment:   Intra-op Plan:   Post-operative Plan:   Informed Consent: I have reviewed the patients History and Physical, chart, labs and discussed the procedure including the risks, benefits and alternatives for the proposed  anesthesia with the patient or authorized representative who has indicated his/her understanding and acceptance.   Dental advisory given  Plan Discussed with: CRNA and Anesthesiologist  Anesthesia Plan Comments:         Anesthesia Quick Evaluation

## 2018-02-14 NOTE — Anesthesia Procedure Notes (Signed)
Procedure Name: LMA Insertion Date/Time: 02/14/2018 2:38 PM Performed by: Timoteo Expose, CRNA Pre-anesthesia Checklist: Patient identified, Emergency Drugs available, Suction available, Patient being monitored and Timeout performed Patient Re-evaluated:Patient Re-evaluated prior to induction Oxygen Delivery Method: Circle system utilized Preoxygenation: Pre-oxygenation with 100% oxygen Induction Type: IV induction Ventilation: Mask ventilation without difficulty LMA: LMA inserted LMA Size: 4.0 Placement Confirmation: ETT inserted through vocal cords under direct vision,  positive ETCO2,  CO2 detector and breath sounds checked- equal and bilateral

## 2018-02-14 NOTE — Progress Notes (Signed)
Doctor Bary Castilla into see

## 2018-02-14 NOTE — Anesthesia Post-op Follow-up Note (Signed)
Anesthesia QCDR form completed.        

## 2018-02-14 NOTE — Progress Notes (Signed)
Ice to incisional area

## 2018-02-14 NOTE — Anesthesia Postprocedure Evaluation (Signed)
Anesthesia Post Note  Patient: Natalie Wood  Procedure(s) Performed: HERNIA REPAIR INGUINAL ADULT (Left )  Patient location during evaluation: PACU Anesthesia Type: General Level of consciousness: awake and alert Pain management: pain level controlled Vital Signs Assessment: post-procedure vital signs reviewed and stable Respiratory status: spontaneous breathing, nonlabored ventilation, respiratory function stable and patient connected to nasal cannula oxygen Cardiovascular status: blood pressure returned to baseline and stable Postop Assessment: no apparent nausea or vomiting Anesthetic complications: no     Last Vitals:  Vitals:   02/14/18 1538 02/14/18 1601  BP: 133/71 117/63  Pulse: 84 84  Resp: 16 16  Temp: 37.1 C   SpO2: 96% 97%    Last Pain:  Vitals:   02/14/18 1538  TempSrc:   PainSc: 0-No pain                 Caige Almeda S

## 2018-02-15 ENCOUNTER — Encounter: Payer: Self-pay | Admitting: General Surgery

## 2018-02-16 ENCOUNTER — Encounter: Payer: Self-pay | Admitting: General Surgery

## 2018-02-22 ENCOUNTER — Ambulatory Visit (INDEPENDENT_AMBULATORY_CARE_PROVIDER_SITE_OTHER): Payer: Medicare Other | Admitting: General Surgery

## 2018-02-22 ENCOUNTER — Encounter: Payer: Self-pay | Admitting: General Surgery

## 2018-02-22 ENCOUNTER — Other Ambulatory Visit: Payer: Self-pay

## 2018-02-22 DIAGNOSIS — K4090 Unilateral inguinal hernia, without obstruction or gangrene, not specified as recurrent: Secondary | ICD-10-CM

## 2018-02-22 NOTE — Progress Notes (Signed)
Patient ID: Natalie Wood, female   DOB: [birth year], 72 y.o.   MRN: [id number]  Chief Complaint  Patient presents with  . Routine Post Op    Open left inguinal hernia    HPI Natalie Wood is a 72 y.o. female.  Here today for postoperative care for open left inguinal hernia repair 02/14/18. Some discomfort when getting up from chair, otherwise doing well.  The patient reports she did not require any narcotics after surgery. HPI  Past Medical History:  Diagnosis Date  . Arthritis    LEFT KNEE  . Complication of anesthesia    HARD TO WAKE UP AFTER A COLONOSCOPY-TENDS TO GET NAUSEATED EASILY  . GERD (gastroesophageal reflux disease)   . PONV (postoperative nausea and vomiting)     Past Surgical History:  Procedure Laterality Date  . BREAST CYST ASPIRATION Bilateral    35 years ago negative  . COLONOSCOPY    . INGUINAL HERNIA REPAIR Left 02/14/2018   Medium ultra Pro mesh, direct hernia.  Surgeon: Robert Bellow, MD;  Location: [facility] ORS;  Service: General;  Laterality: Left;  . OPEN REDUCTION INTERNAL FIXATION (ORIF) DISTAL RADIAL FRACTURE Right 12/19/2015   Procedure: OPEN REDUCTION INTERNAL FIXATION (ORIF) DISTAL RADIAL FRACTURE;  Surgeon: Hessie Knows, MD;  Location: [facility] ORS;  Service: Orthopedics;  Laterality: Right;  . TONSILECTOMY, ADENOIDECTOMY, BILATERAL MYRINGOTOMY AND TUBES    . TONSILLECTOMY      Family History  Problem Relation Age of Onset  . Stroke Mother   . Cancer Father   . Breast cancer Neg Hx     Social History Social History   Tobacco Use  . Smoking status: Former Smoker    Packs/day: 0.25    Years: 20.00    Pack years: 5.00    Types: Cigarettes    Last attempt to quit: 12/18/2003    Years since quitting: 14.1  . Smokeless tobacco: Never Used  Substance Use Topics  . Alcohol use: Yes    Comment: OCC WINE  . Drug use: No    Allergies  Allergen Reactions  . Hydrocodone Nausea And Vomiting    Current Outpatient Medications  Medication Sig  Dispense Refill  . acetaminophen (TYLENOL) 325 MG tablet Take 650 mg by mouth every 6 (six) hours as needed for moderate pain.     Marland Kitchen aspirin 81 MG EC tablet Take 81 mg by mouth daily.     . Calcium-Vitamin D-Vitamin K (CALCIUM + D + K) 750-500-40 MG-UNT-MCG TABS Take 1 tablet by mouth daily.    . Cholecalciferol (VITAMIN D-1000 MAX ST) 25 MCG (1000 UT) tablet Take 3,000 Units by mouth daily.     . Cyanocobalamin (VITAMIN B-12) 2500 MCG SUBL Place 2,500 mcg under the tongue 3 (three) times a week.     . docusate sodium (COLACE) 100 MG capsule Take 100 mg by mouth daily.     Marland Kitchen loratadine (CLARITIN) 10 MG tablet Take 10 mg by mouth daily as needed for allergies.     Marland Kitchen lovastatin (MEVACOR) 40 MG tablet Take 40 mg by mouth at bedtime.    . meclizine (ANTIVERT) 25 MG tablet Take 25 mg by mouth 2 (two) times daily as needed for dizziness.     . Multiple Vitamin (MULTIVITAMIN) tablet Take 1 tablet by mouth daily.    Marland Kitchen omeprazole (PRILOSEC) 20 MG capsule Take 20 mg by mouth daily as needed (heartburn).     . zolpidem (AMBIEN) 5 MG tablet Take 5 mg by  mouth at bedtime as needed for sleep.      No current facility-administered medications for this visit.     Review of Systems Review of Systems  Constitutional: Negative.   Respiratory: Negative.   Skin: Negative.     There were no vitals taken for this visit.  Physical Exam Physical Exam  Constitutional: She is oriented to person, place, and time. She appears well-developed and well-nourished.  Pulmonary/Chest: Effort normal.  Abdominal:    Neurological: She is alert and oriented to person, place, and time.  Skin: Skin is warm and dry.     Assessment    Doing well post left inguinal hernia repair with prosthetic mesh.    Plan  Please call if you have any questions or concerns.   Proper lifting technique was reviewed.  She may begin judicious exercise, walking as she is comfortable.      HPI, Physical Exam, Assessment and  Plan have been scribed under the direction and in the presence of Robert Bellow, MD. Jonnie Finner, CMA  I have completed the exam and reviewed the above documentation for accuracy and completeness.  I agree with the above.  Haematologist has been used and any errors in dictation or transcription are unintentional.  Hervey Ard, M.D., F.A.C.S.  [name] 02/22/2018, 9:02 PM

## 2018-02-22 NOTE — Patient Instructions (Signed)
Please call if you have any questions or concerns.  °

## 2018-12-06 ENCOUNTER — Other Ambulatory Visit: Payer: Self-pay | Admitting: Internal Medicine

## 2018-12-06 DIAGNOSIS — Z1231 Encounter for screening mammogram for malignant neoplasm of breast: Secondary | ICD-10-CM

## 2019-01-12 ENCOUNTER — Ambulatory Visit
Admission: RE | Admit: 2019-01-12 | Discharge: 2019-01-12 | Disposition: A | Discharge status: Home / Self Care | Payer: Medicare Other | Source: Ambulatory Visit | Attending: Internal Medicine | Admitting: Internal Medicine

## 2019-01-12 DIAGNOSIS — Z1231 Encounter for screening mammogram for malignant neoplasm of breast: Secondary | ICD-10-CM | POA: Diagnosis present

## 2019-12-11 ENCOUNTER — Other Ambulatory Visit: Payer: Self-pay | Admitting: Internal Medicine

## 2019-12-11 DIAGNOSIS — Z1231 Encounter for screening mammogram for malignant neoplasm of breast: Secondary | ICD-10-CM

## 2020-01-15 ENCOUNTER — Other Ambulatory Visit: Payer: Self-pay

## 2020-01-15 ENCOUNTER — Ambulatory Visit
Admission: RE | Admit: 2020-01-15 | Discharge: 2020-01-15 | Disposition: A | Discharge status: Home / Self Care | Payer: Medicare PPO | Source: Ambulatory Visit | Attending: Internal Medicine | Admitting: Internal Medicine

## 2020-01-15 DIAGNOSIS — Z1231 Encounter for screening mammogram for malignant neoplasm of breast: Secondary | ICD-10-CM | POA: Diagnosis not present

## 2020-05-20 ENCOUNTER — Encounter (INDEPENDENT_AMBULATORY_CARE_PROVIDER_SITE_OTHER): Payer: Medicare PPO | Admitting: Ophthalmology

## 2020-05-20 ENCOUNTER — Other Ambulatory Visit: Payer: Self-pay

## 2020-05-20 DIAGNOSIS — H2513 Age-related nuclear cataract, bilateral: Secondary | ICD-10-CM

## 2020-05-20 DIAGNOSIS — H43813 Vitreous degeneration, bilateral: Secondary | ICD-10-CM

## 2020-05-20 DIAGNOSIS — H5213 Myopia, bilateral: Secondary | ICD-10-CM

## 2020-05-20 DIAGNOSIS — H35433 Paving stone degeneration of retina, bilateral: Secondary | ICD-10-CM | POA: Diagnosis not present

## 2020-12-09 ENCOUNTER — Other Ambulatory Visit: Payer: Self-pay | Admitting: Internal Medicine

## 2020-12-09 DIAGNOSIS — Z1231 Encounter for screening mammogram for malignant neoplasm of breast: Secondary | ICD-10-CM

## 2021-01-23 ENCOUNTER — Other Ambulatory Visit: Payer: Self-pay

## 2021-01-23 ENCOUNTER — Ambulatory Visit
Admission: RE | Admit: 2021-01-23 | Discharge: 2021-01-23 | Disposition: A | Discharge status: Home / Self Care | Payer: Medicare PPO | Source: Ambulatory Visit | Attending: Internal Medicine | Admitting: Internal Medicine

## 2021-01-23 DIAGNOSIS — Z1231 Encounter for screening mammogram for malignant neoplasm of breast: Secondary | ICD-10-CM | POA: Diagnosis present

## 2021-04-24 DIAGNOSIS — L821 Other seborrheic keratosis: Secondary | ICD-10-CM | POA: Diagnosis not present

## 2021-04-24 DIAGNOSIS — L820 Inflamed seborrheic keratosis: Secondary | ICD-10-CM | POA: Diagnosis not present

## 2021-04-24 DIAGNOSIS — L538 Other specified erythematous conditions: Secondary | ICD-10-CM | POA: Diagnosis not present

## 2021-04-24 DIAGNOSIS — D225 Melanocytic nevi of trunk: Secondary | ICD-10-CM | POA: Diagnosis not present

## 2021-04-24 DIAGNOSIS — D2262 Melanocytic nevi of left upper limb, including shoulder: Secondary | ICD-10-CM | POA: Diagnosis not present

## 2021-04-24 DIAGNOSIS — L814 Other melanin hyperpigmentation: Secondary | ICD-10-CM | POA: Diagnosis not present

## 2021-04-24 DIAGNOSIS — D2271 Melanocytic nevi of right lower limb, including hip: Secondary | ICD-10-CM | POA: Diagnosis not present

## 2021-04-24 DIAGNOSIS — D2261 Melanocytic nevi of right upper limb, including shoulder: Secondary | ICD-10-CM | POA: Diagnosis not present

## 2021-04-24 DIAGNOSIS — L298 Other pruritus: Secondary | ICD-10-CM | POA: Diagnosis not present

## 2021-04-24 DIAGNOSIS — D2272 Melanocytic nevi of left lower limb, including hip: Secondary | ICD-10-CM | POA: Diagnosis not present

## 2021-05-28 DIAGNOSIS — R200 Anesthesia of skin: Secondary | ICD-10-CM | POA: Diagnosis not present

## 2021-05-28 DIAGNOSIS — R202 Paresthesia of skin: Secondary | ICD-10-CM | POA: Diagnosis not present

## 2021-06-13 DIAGNOSIS — R1032 Left lower quadrant pain: Secondary | ICD-10-CM | POA: Diagnosis not present

## 2021-06-13 DIAGNOSIS — E538 Deficiency of other specified B group vitamins: Secondary | ICD-10-CM | POA: Diagnosis not present

## 2021-06-13 DIAGNOSIS — E782 Mixed hyperlipidemia: Secondary | ICD-10-CM | POA: Diagnosis not present

## 2021-06-23 DIAGNOSIS — E782 Mixed hyperlipidemia: Secondary | ICD-10-CM | POA: Diagnosis not present

## 2021-06-23 DIAGNOSIS — Z0000 Encounter for general adult medical examination without abnormal findings: Secondary | ICD-10-CM | POA: Diagnosis not present

## 2021-06-23 DIAGNOSIS — E538 Deficiency of other specified B group vitamins: Secondary | ICD-10-CM | POA: Diagnosis not present

## 2021-06-23 DIAGNOSIS — Z1212 Encounter for screening for malignant neoplasm of rectum: Secondary | ICD-10-CM | POA: Diagnosis not present

## 2021-06-28 DIAGNOSIS — M7989 Other specified soft tissue disorders: Secondary | ICD-10-CM | POA: Diagnosis not present

## 2021-06-28 DIAGNOSIS — G5602 Carpal tunnel syndrome, left upper limb: Secondary | ICD-10-CM | POA: Diagnosis not present

## 2021-06-28 DIAGNOSIS — M19042 Primary osteoarthritis, left hand: Secondary | ICD-10-CM | POA: Diagnosis not present

## 2021-07-14 DIAGNOSIS — R768 Other specified abnormal immunological findings in serum: Secondary | ICD-10-CM | POA: Diagnosis not present

## 2021-07-14 DIAGNOSIS — G5602 Carpal tunnel syndrome, left upper limb: Secondary | ICD-10-CM | POA: Diagnosis not present

## 2021-07-14 DIAGNOSIS — M19042 Primary osteoarthritis, left hand: Secondary | ICD-10-CM | POA: Diagnosis not present

## 2021-07-14 DIAGNOSIS — M65322 Trigger finger, left index finger: Secondary | ICD-10-CM | POA: Diagnosis not present

## 2021-07-14 DIAGNOSIS — M19041 Primary osteoarthritis, right hand: Secondary | ICD-10-CM | POA: Diagnosis not present

## 2021-07-24 DIAGNOSIS — Z9841 Cataract extraction status, right eye: Secondary | ICD-10-CM | POA: Diagnosis not present

## 2021-07-24 DIAGNOSIS — H52223 Regular astigmatism, bilateral: Secondary | ICD-10-CM | POA: Diagnosis not present

## 2021-07-24 DIAGNOSIS — Z9842 Cataract extraction status, left eye: Secondary | ICD-10-CM | POA: Diagnosis not present

## 2021-07-29 DIAGNOSIS — E782 Mixed hyperlipidemia: Secondary | ICD-10-CM | POA: Diagnosis not present

## 2021-07-29 DIAGNOSIS — E041 Nontoxic single thyroid nodule: Secondary | ICD-10-CM | POA: Diagnosis not present

## 2021-07-29 DIAGNOSIS — I6523 Occlusion and stenosis of bilateral carotid arteries: Secondary | ICD-10-CM | POA: Diagnosis not present

## 2021-07-29 DIAGNOSIS — E785 Hyperlipidemia, unspecified: Secondary | ICD-10-CM | POA: Diagnosis not present

## 2021-08-13 DIAGNOSIS — Z1211 Encounter for screening for malignant neoplasm of colon: Secondary | ICD-10-CM | POA: Diagnosis not present

## 2021-09-16 DIAGNOSIS — E041 Nontoxic single thyroid nodule: Secondary | ICD-10-CM | POA: Diagnosis not present

## 2021-10-02 DIAGNOSIS — L218 Other seborrheic dermatitis: Secondary | ICD-10-CM | POA: Diagnosis not present

## 2021-10-27 DIAGNOSIS — E041 Nontoxic single thyroid nodule: Secondary | ICD-10-CM | POA: Diagnosis not present

## 2021-11-13 DIAGNOSIS — E041 Nontoxic single thyroid nodule: Secondary | ICD-10-CM | POA: Diagnosis not present

## 2021-11-21 ENCOUNTER — Encounter: Payer: Self-pay | Admitting: *Deleted

## 2021-11-24 ENCOUNTER — Ambulatory Visit
Admission: RE | Admit: 2021-11-24 | Discharge: 2021-11-24 | Disposition: A | Discharge status: Home / Self Care | Payer: PPO | Attending: Gastroenterology | Admitting: Gastroenterology

## 2021-11-24 ENCOUNTER — Encounter
Admission: RE | Disposition: A | Discharge status: Home / Self Care | Payer: Self-pay | Source: Home / Self Care | Attending: Gastroenterology

## 2021-11-24 ENCOUNTER — Ambulatory Visit: Payer: PPO | Admitting: Anesthesiology

## 2021-11-24 ENCOUNTER — Encounter: Payer: Self-pay | Admitting: *Deleted

## 2021-11-24 DIAGNOSIS — D122 Benign neoplasm of ascending colon: Secondary | ICD-10-CM | POA: Insufficient documentation

## 2021-11-24 DIAGNOSIS — D123 Benign neoplasm of transverse colon: Secondary | ICD-10-CM | POA: Diagnosis not present

## 2021-11-24 DIAGNOSIS — Q438 Other specified congenital malformations of intestine: Secondary | ICD-10-CM | POA: Insufficient documentation

## 2021-11-24 DIAGNOSIS — K649 Unspecified hemorrhoids: Secondary | ICD-10-CM | POA: Diagnosis not present

## 2021-11-24 DIAGNOSIS — K640 First degree hemorrhoids: Secondary | ICD-10-CM | POA: Insufficient documentation

## 2021-11-24 DIAGNOSIS — Z1211 Encounter for screening for malignant neoplasm of colon: Secondary | ICD-10-CM | POA: Diagnosis not present

## 2021-11-24 DIAGNOSIS — E785 Hyperlipidemia, unspecified: Secondary | ICD-10-CM | POA: Insufficient documentation

## 2021-11-24 DIAGNOSIS — K635 Polyp of colon: Secondary | ICD-10-CM | POA: Diagnosis not present

## 2021-11-24 DIAGNOSIS — K5730 Diverticulosis of large intestine without perforation or abscess without bleeding: Secondary | ICD-10-CM | POA: Insufficient documentation

## 2021-11-24 DIAGNOSIS — D126 Benign neoplasm of colon, unspecified: Secondary | ICD-10-CM | POA: Diagnosis not present

## 2021-11-24 HISTORY — PX: COLONOSCOPY WITH PROPOFOL: SHX5780

## 2021-11-24 SURGERY — COLONOSCOPY WITH PROPOFOL
Anesthesia: General

## 2021-11-24 MED ORDER — SODIUM CHLORIDE 0.9 % IV SOLN: INTRAVENOUS | Status: DC

## 2021-11-24 MED ORDER — PROPOFOL 1000 MG/100ML IV EMUL
INTRAVENOUS | Status: AC
  Filled 2021-11-24: qty 100

## 2021-11-24 MED ORDER — PROPOFOL 500 MG/50ML IV EMUL
INTRAVENOUS | Status: DC | PRN
  Administered 2021-11-24: 150 ug/kg/min via INTRAVENOUS

## 2021-11-24 NOTE — Anesthesia Postprocedure Evaluation (Signed)
Anesthesia Post Note  Patient: Nijae E Pagliuca  Procedure(s) Performed: COLONOSCOPY WITH PROPOFOL  Patient location during evaluation: Endoscopy Anesthesia Type: General Level of consciousness: awake and alert Pain management: pain level controlled Vital Signs Assessment: post-procedure vital signs reviewed and stable Respiratory status: spontaneous breathing, nonlabored ventilation, respiratory function stable and patient connected to nasal cannula oxygen Cardiovascular status: blood pressure returned to baseline and stable Postop Assessment: no apparent nausea or vomiting Anesthetic complications: no   No notable events documented.   Last Vitals:  Vitals:   11/24/21 0855 11/24/21 0905  BP: 119/69 136/73  Pulse: 76 74  Resp: 17 (!) 21  Temp:    SpO2: 99% 98%    Last Pain:  Vitals:   11/24/21 0905  TempSrc:   PainSc: 0-No pain                 Precious Haws Tadarrius Burch

## 2021-11-24 NOTE — Op Note (Signed)
[facility] Patient Name: Natalie Wood Procedure Date: 11/24/2021 8:03 AM MRN: [id number] Account #: [id number] Date of Birth: March 01, 1946 Admit Type: Outpatient Age: 76 Room: [facility] ENDO ROOM 3 Gender: Female Note Status: Finalized Instrument Name: Jasper Riling [id number] Procedure:             Colonoscopy Indications:           Screening for colorectal malignant neoplasm Providers:             Andrey Farmer MD, MD Referring MD:          Rusty Aus, MD (Referring MD) Medicines:             Monitored Anesthesia Care Complications:         No immediate complications. Estimated blood loss:                         Minimal. Procedure:             Pre-Anesthesia Assessment:                        - Prior to the procedure, a History and Physical was                         performed, and patient medications and allergies were                         reviewed. The patient is competent. The risks and                         benefits of the procedure and the sedation options and                         risks were discussed with the patient. All questions                         were answered and informed consent was obtained.                         Patient identification and proposed procedure were                         verified by the physician, the nurse, the                         anesthesiologist, the anesthetist and the technician                         in the endoscopy suite. Mental Status Examination:                         alert and oriented. Airway Examination: normal                         oropharyngeal airway and neck mobility. Respiratory                         Examination: clear to auscultation. CV Examination:  normal. Prophylactic Antibiotics: The patient does not                         require prophylactic antibiotics. Prior                         Anticoagulants: The patient has taken no previous                          anticoagulant or antiplatelet agents. ASA Grade                         Assessment: II - A patient with mild systemic disease.                         After reviewing the risks and benefits, the patient                         was deemed in satisfactory condition to undergo the                         procedure. The anesthesia plan was to use monitored                         anesthesia care (MAC). Immediately prior to                         administration of medications, the patient was                         re-assessed for adequacy to receive sedatives. The                         heart rate, respiratory rate, oxygen saturations,                         blood pressure, adequacy of pulmonary ventilation, and                         response to care were monitored throughout the                         procedure. The physical status of the patient was                         re-assessed after the procedure.                        After obtaining informed consent, the colonoscope was                         passed under direct vision. Throughout the procedure,                         the patient's blood pressure, pulse, and oxygen                         saturations were monitored continuously. The  Colonoscope was introduced through the anus and                         advanced to the the cecum, identified by appendiceal                         orifice and ileocecal valve. The colonoscopy was                         somewhat difficult due to significant looping.                         Successful completion of the procedure was aided by                         applying abdominal pressure. The patient tolerated the                         procedure well. The quality of the bowel preparation                         was good. Findings:      The perianal and digital rectal examinations were normal.      A 3 mm polyp was found in the ascending colon. The  polyp was sessile.       The polyp was removed with a cold snare. Resection and retrieval were       complete. Estimated blood loss was minimal.      Four sessile polyps were found in the transverse colon. The polyps were       2 to 8 mm in size. These polyps were removed with a cold snare.       Resection and retrieval were complete. On the 8 mm polyp large defect       was created from the cold snare, to prevent bleeding after the       polypectomy, two hemostatic clips were successfully placed. There was no       bleeding during, or at the end, of the procedure.      Multiple small and large-mouthed diverticula were found in the sigmoid       colon, descending colon, transverse colon and ascending colon.      Internal hemorrhoids were found during retroflexion. The hemorrhoids       were Grade I (internal hemorrhoids that do not prolapse).      The exam was otherwise without abnormality on direct and retroflexion       views. Impression:            - One 3 mm polyp in the ascending colon, removed with                         a cold snare. Resected and retrieved.                        - Four 2 to 8 mm polyps in the transverse colon,                         removed with a cold snare. Resected and retrieved.  Clips were placed.                        - Diverticulosis in the sigmoid colon, in the                         descending colon, in the transverse colon and in the                         ascending colon.                        - Internal hemorrhoids.                        - The examination was otherwise normal on direct and                         retroflexion views. Recommendation:        - Discharge patient to home.                        - Resume previous diet.                        - Continue present medications.                        - Await pathology results.                        - Repeat colonoscopy in 3 years for surveillance based                          on clinical status at that time.                        - Return to referring physician as previously                         scheduled. Procedure Code(s):     --- Professional ---                        [phone], Colonoscopy, flexible; with removal of                         tumor(s), polyp(s), or other lesion(s) by snare                         technique Diagnosis Code(s):     --- Professional ---                        K63.5, Polyp of colon                        Z12.11, Encounter for screening for malignant neoplasm                         of colon                        K64.0, First  degree hemorrhoids                        K57.30, Diverticulosis of large intestine without                         perforation or abscess without bleeding CPT copyright 2019 American Medical Association. All rights reserved. The codes documented in this report are preliminary and upon coder review may  be revised to meet current compliance requirements. Andrey Farmer MD, MD 11/24/2021 8:46:29 AM Number of Addenda: 0 Note Initiated On: 11/24/2021 8:03 AM Scope Withdrawal Time: 0 hours 16 minutes 57 seconds  Total Procedure Duration: 0 hours 27 minutes 16 seconds  Estimated Blood Loss:  Estimated blood loss was minimal.      [facility]

## 2021-11-24 NOTE — Transfer of Care (Signed)
Immediate Anesthesia Transfer of Care Note  Patient: Natalie Wood  Procedure(s) Performed: COLONOSCOPY WITH PROPOFOL  Patient Location: PACU  Anesthesia Type:General  Level of Consciousness: awake and sedated  Airway & Oxygen Therapy: Patient Spontanous Breathing and Patient connected to nasal cannula oxygen  Post-op Assessment: Report given to RN and Post -op Vital signs reviewed and stable  Post vital signs: Reviewed and stable  Last Vitals:  Vitals Value Taken Time  BP    Temp    Pulse    Resp    SpO2      Last Pain:  Vitals:   11/24/21 0757  TempSrc: Temporal  PainSc: 0-No pain         Complications: No notable events documented.

## 2021-11-24 NOTE — H&P (Signed)
Outpatient short stay form Pre-procedure 11/24/2021  Lesly Rubenstein, MD  Primary Physician: Rusty Aus, MD  Reason for visit:  Screening  History of present illness:    76 y/o lady with history of HLD here for screening colonoscopy. States she had a colonoscopy over 10 years ago. No blood thinners. No family history of GI malignancies. History of inguinal hernia repair. History of diverticulitis in March of this year but no further pain since then.    Current Facility-Administered Medications:    0.9 %  sodium chloride infusion, , Intravenous, Continuous, Aleysha Meckler, Hilton Cork, MD, Last Rate: 20 mL/hr at 11/24/21 0800, New Bag at 11/24/21 0800  Medications Prior to Admission  Medication Sig Dispense Refill Last Dose   aspirin 81 MG EC tablet Take 81 mg by mouth daily.    Past Week   Calcium-Vitamin D-Vitamin K (CALCIUM + D + K) 750-500-40 MG-UNT-MCG TABS Take 1 tablet by mouth daily.   Past Week   Cholecalciferol (VITAMIN D-1000 MAX ST) 25 MCG (1000 UT) tablet Take 3,000 Units by mouth daily.    Past Week   Cyanocobalamin (VITAMIN B-12) 2500 MCG SUBL Place 2,500 mcg under the tongue 3 (three) times a week.    Past Week   docusate sodium (COLACE) 100 MG capsule Take 100 mg by mouth daily.    Past Week   loratadine (CLARITIN) 10 MG tablet Take 10 mg by mouth daily as needed for allergies.    Past Week   lovastatin (MEVACOR) 40 MG tablet Take 40 mg by mouth at bedtime.   Past Week   Multiple Vitamin (MULTIVITAMIN) tablet Take 1 tablet by mouth daily.   Past Week   acetaminophen (TYLENOL) 325 MG tablet Take 650 mg by mouth every 6 (six) hours as needed for moderate pain.       meclizine (ANTIVERT) 25 MG tablet Take 25 mg by mouth 2 (two) times daily as needed for dizziness.       omeprazole (PRILOSEC) 20 MG capsule Take 20 mg by mouth daily as needed (heartburn).  (Patient not taking: Reported on 11/24/2021)   Not Taking   zolpidem (AMBIEN) 5 MG tablet Take 5 mg by mouth at bedtime as  needed for sleep.  (Patient not taking: Reported on 11/24/2021)   Not Taking     Allergies  Allergen Reactions   Hydrocodone Nausea And Vomiting     Past Medical History:  Diagnosis Date   Arthritis    LEFT KNEE   Complication of anesthesia    HARD TO WAKE UP AFTER A COLONOSCOPY-TENDS TO GET NAUSEATED EASILY   GERD (gastroesophageal reflux disease)    PONV (postoperative nausea and vomiting)     Review of systems:  Otherwise negative.    Physical Exam  Gen: Alert, oriented. Appears stated age.  HEENT: PERRLA. Lungs: No respiratory distress CV: RRR Abd: soft, benign, no masses Ext: No edema    Planned procedures: Proceed with colonoscopy. The patient understands the nature of the planned procedure, indications, risks, alternatives and potential complications including but not limited to bleeding, infection, perforation, damage to internal organs and possible oversedation/side effects from anesthesia. The patient agrees and gives consent to proceed.  Please refer to procedure notes for findings, recommendations and patient disposition/instructions.     Lesly Rubenstein, MD [facility]

## 2021-11-24 NOTE — Anesthesia Preprocedure Evaluation (Signed)
Anesthesia Evaluation  Patient identified by MRN, date of birth, ID band Patient awake    Reviewed: Allergy & Precautions, NPO status , Patient's Chart, lab work & pertinent test results  History of Anesthesia Complications (+) PONV, PROLONGED EMERGENCE and history of anesthetic complications  Airway Mallampati: III  TM Distance: <3 FB Neck ROM: full    Dental  (+) Chipped   Pulmonary neg shortness of breath, former smoker,    Pulmonary exam normal        Cardiovascular Exercise Tolerance: Good (-) angina(-) Past MI negative cardio ROS Normal cardiovascular exam     Neuro/Psych negative neurological ROS  negative psych ROS   GI/Hepatic Neg liver ROS, GERD  Controlled,  Endo/Other  negative endocrine ROS  Renal/GU negative Renal ROS  negative genitourinary   Musculoskeletal   Abdominal   Peds  Hematology negative hematology ROS (+)   Anesthesia Other Findings Past Medical History: No date: Arthritis     Comment:  LEFT KNEE No date: Complication of anesthesia     Comment:  HARD TO WAKE UP AFTER A COLONOSCOPY-TENDS TO GET               NAUSEATED EASILY No date: GERD (gastroesophageal reflux disease) No date: PONV (postoperative nausea and vomiting)  Past Surgical History: No date: BREAST CYST ASPIRATION; Bilateral     Comment:  35 years ago negative No date: COLONOSCOPY No date: FRACTURE SURGERY No date: HERNIA REPAIR 02/14/2018: INGUINAL HERNIA REPAIR; Left     Comment:  Medium ultra Pro mesh, direct hernia.  Surgeon: Robert Bellow, MD;  Location: [facility] ORS;  Service: General;                Laterality: Left; 12/19/2015: OPEN REDUCTION INTERNAL FIXATION (ORIF) DISTAL RADIAL  FRACTURE; Right     Comment:  Procedure: OPEN REDUCTION INTERNAL FIXATION (ORIF)               DISTAL RADIAL FRACTURE;  Surgeon: Hessie Knows, MD;                Location: [facility] ORS;  Service: Orthopedics;   Laterality:               Right; No date: OTHER SURGICAL HISTORY     Comment:  fine needle aspiration thyroid No date: TONSILECTOMY, ADENOIDECTOMY, BILATERAL MYRINGOTOMY AND TUBES No date: TONSILLECTOMY  BMI    Body Mass Index: 30.91 kg/m      Reproductive/Obstetrics negative OB ROS                             Anesthesia Physical Anesthesia Plan  ASA: 3  Anesthesia Plan: General   Post-op Pain Management:    Induction: Intravenous  PONV Risk Score and Plan: Propofol infusion and TIVA  Airway Management Planned: Natural Airway and Nasal Cannula  Additional Equipment:   Intra-op Plan:   Post-operative Plan:   Informed Consent: I have reviewed the patients History and Physical, chart, labs and discussed the procedure including the risks, benefits and alternatives for the proposed anesthesia with the patient or authorized representative who has indicated his/her understanding and acceptance.     Dental Advisory Given  Plan Discussed with: Anesthesiologist, CRNA and Surgeon  Anesthesia Plan Comments: (Patient consented for risks of anesthesia including but not limited to:  - adverse reactions to medications - risk of airway placement  if required - damage to eyes, teeth, lips or other oral mucosa - nerve damage due to positioning  - sore throat or hoarseness - Damage to heart, brain, nerves, lungs, other parts of body or loss of life  Patient voiced understanding.)        Anesthesia Quick Evaluation

## 2021-11-24 NOTE — Interval H&P Note (Signed)
History and Physical Interval Note:  11/24/2021 8:04 AM  Natalie Wood  has presented today for surgery, with the diagnosis of Colon Cancer Screening.  The various methods of treatment have been discussed with the patient and family. After consideration of risks, benefits and other options for treatment, the patient has consented to  Procedure(s): COLONOSCOPY WITH PROPOFOL (N/A) as a surgical intervention.  The patient's history has been reviewed, patient examined, no change in status, stable for surgery.  I have reviewed the patient's chart and labs.  Questions were answered to the patient's satisfaction.     Lesly Rubenstein  Ok to proceed with colonoscopy

## 2021-11-24 NOTE — Anesthesia Procedure Notes (Signed)
Date/Time: 11/24/2021 8:17 AM  Performed by: Donalda Ewings, CindyPre-anesthesia Checklist: Patient identified, Emergency Drugs available, Suction available, Patient being monitored and Timeout performed Patient Re-evaluated:Patient Re-evaluated prior to induction Oxygen Delivery Method: Simple face mask Preoxygenation: Pre-oxygenation with 100% oxygen Induction Type: IV induction Placement Confirmation: positive ETCO2 and CO2 detector

## 2021-11-25 ENCOUNTER — Encounter: Payer: Self-pay | Admitting: Gastroenterology

## 2021-11-25 LAB — SURGICAL PATHOLOGY

## 2021-12-17 ENCOUNTER — Other Ambulatory Visit: Payer: Self-pay | Admitting: Internal Medicine

## 2021-12-17 DIAGNOSIS — Z1231 Encounter for screening mammogram for malignant neoplasm of breast: Secondary | ICD-10-CM

## 2021-12-25 DIAGNOSIS — E041 Nontoxic single thyroid nodule: Secondary | ICD-10-CM | POA: Diagnosis not present

## 2021-12-26 DIAGNOSIS — E042 Nontoxic multinodular goiter: Secondary | ICD-10-CM | POA: Diagnosis not present

## 2022-01-13 DIAGNOSIS — J0100 Acute maxillary sinusitis, unspecified: Secondary | ICD-10-CM | POA: Diagnosis not present

## 2022-01-13 DIAGNOSIS — M542 Cervicalgia: Secondary | ICD-10-CM | POA: Diagnosis not present

## 2022-01-26 ENCOUNTER — Ambulatory Visit
Admission: RE | Admit: 2022-01-26 | Discharge: 2022-01-26 | Disposition: A | Discharge status: Home / Self Care | Payer: PPO | Source: Ambulatory Visit | Attending: Internal Medicine | Admitting: Internal Medicine

## 2022-01-26 DIAGNOSIS — Z1231 Encounter for screening mammogram for malignant neoplasm of breast: Secondary | ICD-10-CM | POA: Diagnosis not present

## 2022-02-23 IMAGING — MG MM DIGITAL SCREENING BILAT W/ TOMO AND CAD
6 of 10 series · 6 of 30 positions shown · non-contrast
Comparison: Previous exam(s).

CLINICAL DATA: Screening.

EXAM:
DIGITAL SCREENING BILATERAL MAMMOGRAM WITH TOMOSYNTHESIS AND CAD
TECHNIQUE: Bilateral screening digital craniocaudal and mediolateral oblique
mammograms were obtained. Bilateral screening digital breast
tomosynthesis was performed. The images were evaluated with
computer-aided detection.

[R MLO synth-2D]
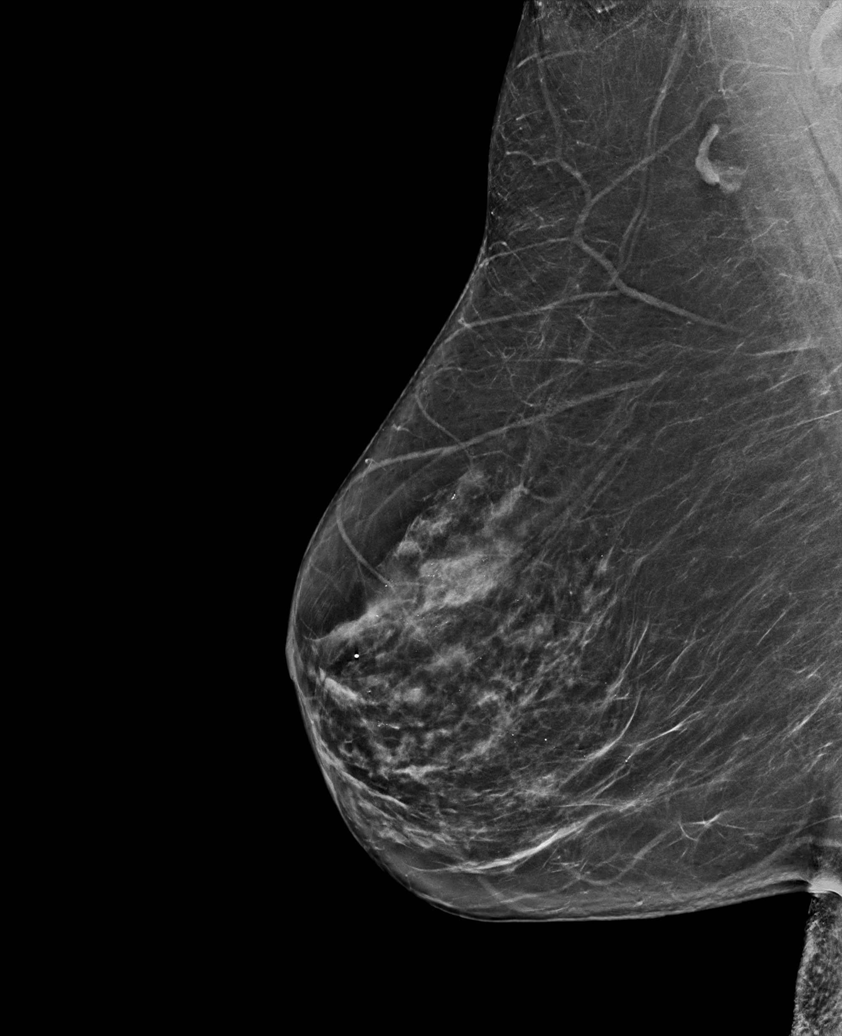

[L MLO synth-2D (1 of 2)]
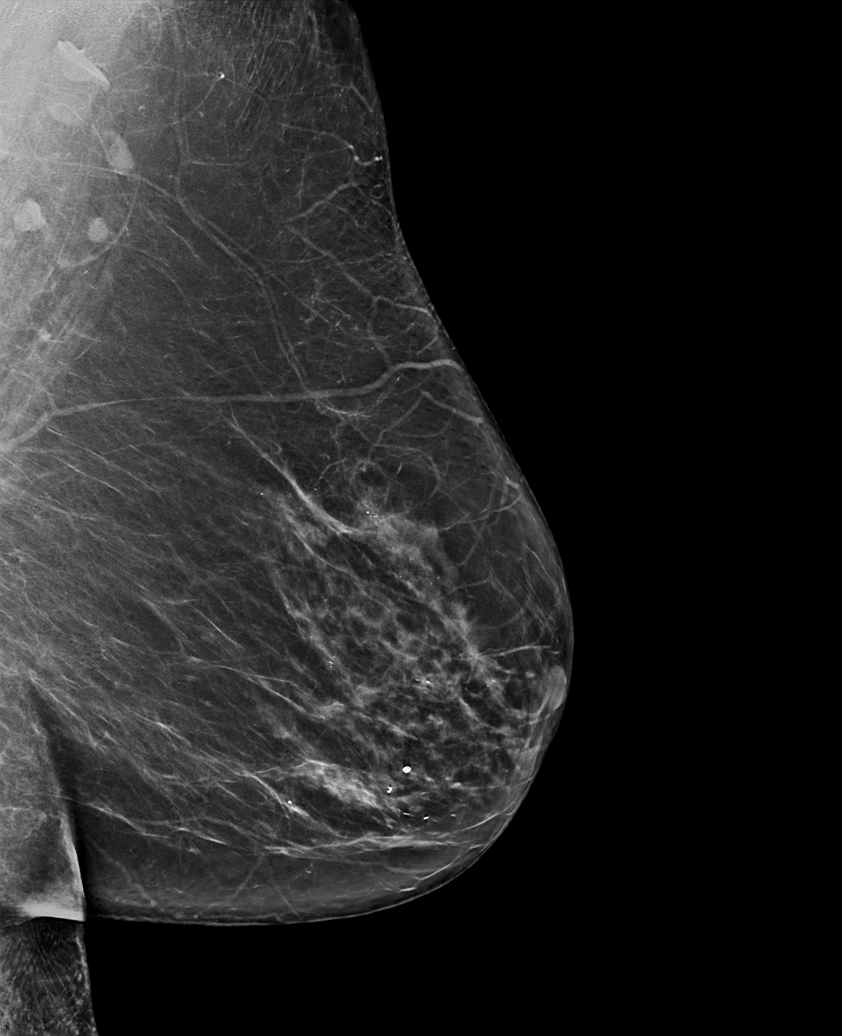

[L MLO synth-2D (2 of 2)]
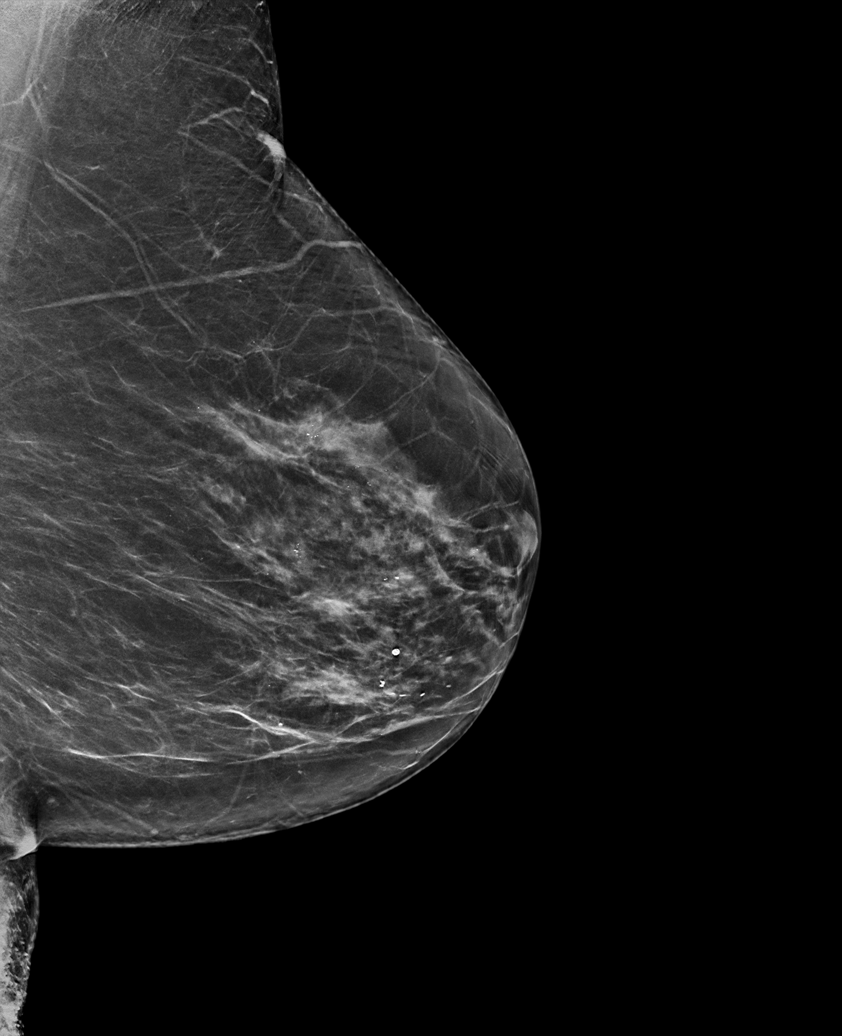

[R CC synth-2D]
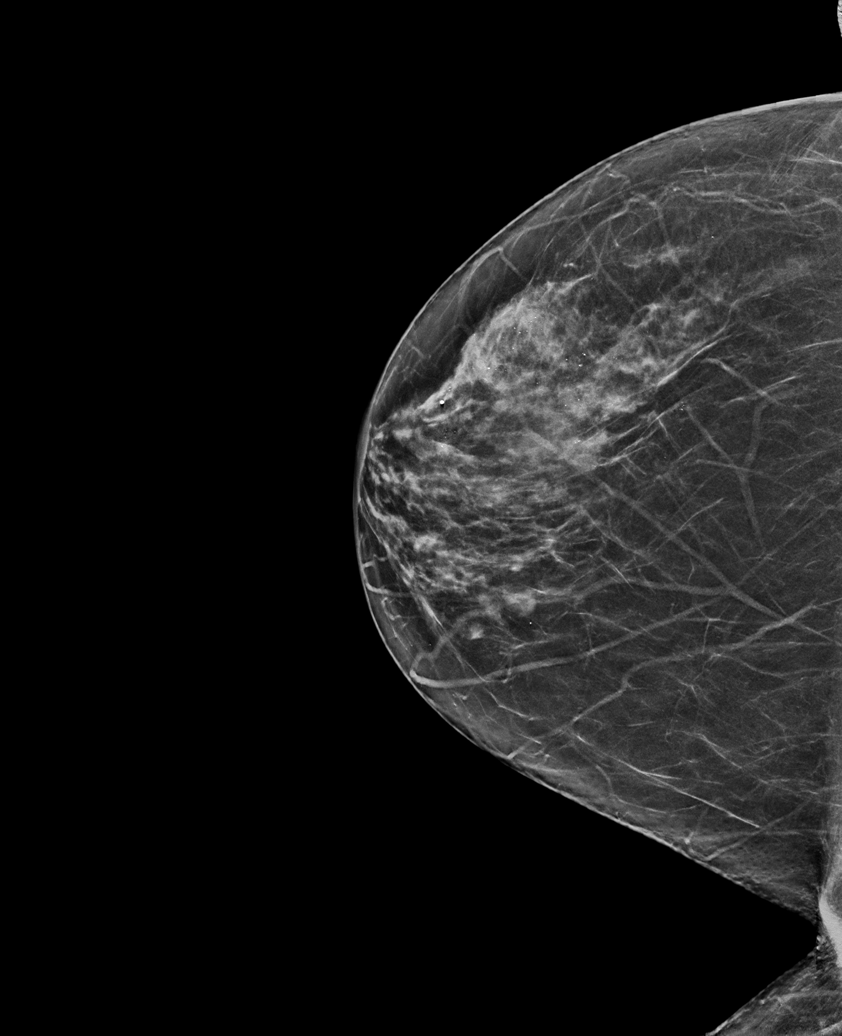

[L CC synth-2D]
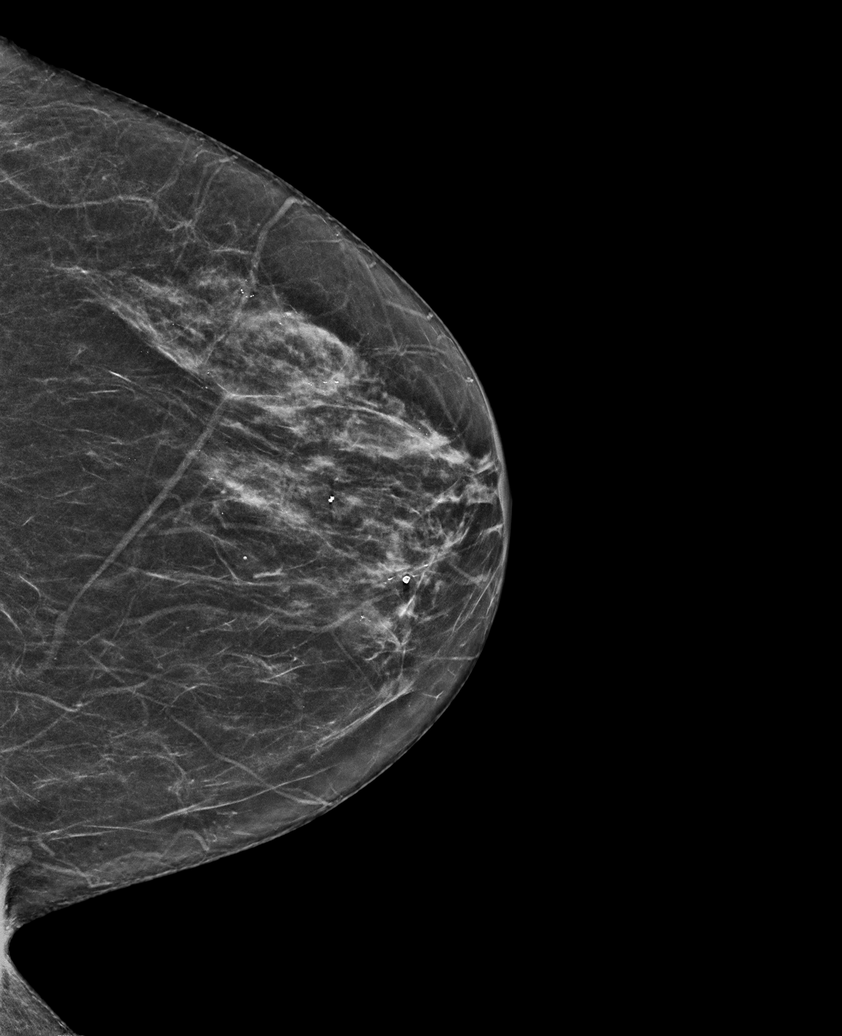

[R MLO tomo · tomo slice 39/76.0]
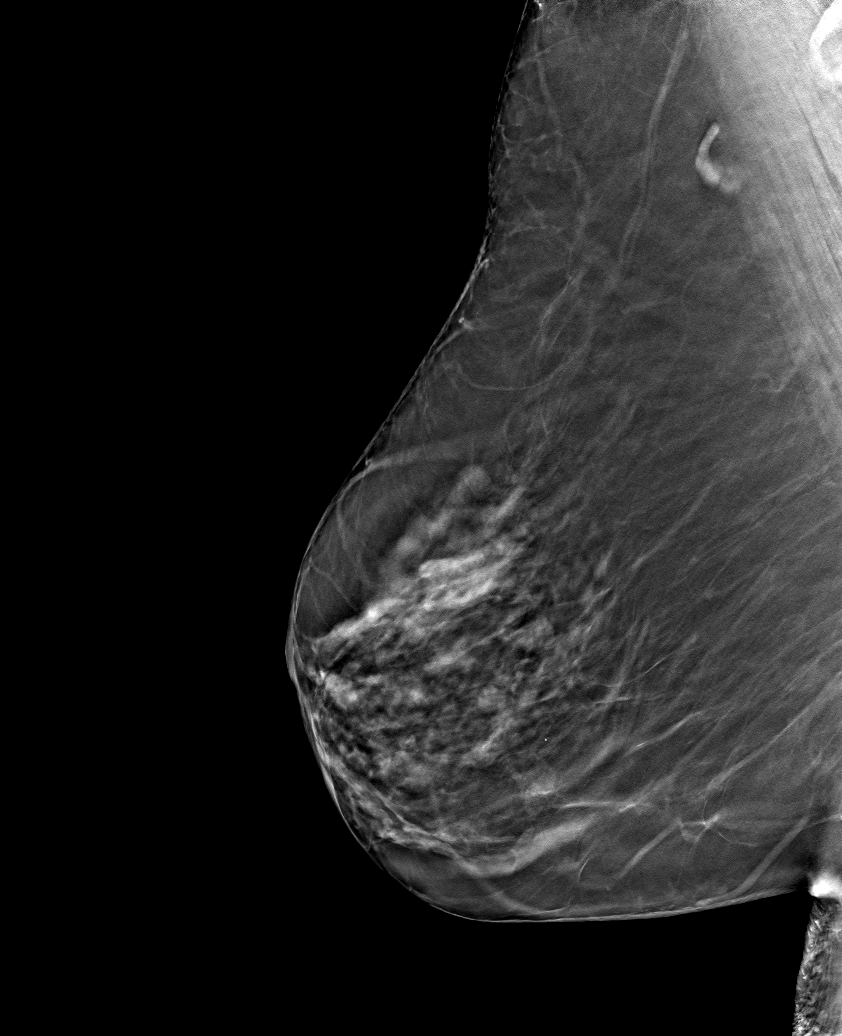

[6 of 30 positions shown; findings below may reference images not displayed]

ACR Breast Density Category c: The breast tissue is heterogeneously
dense, which may obscure small masses.
FINDINGS: There are no findings suspicious for malignancy.
IMPRESSION: No mammographic evidence of malignancy. A result letter of this
screening mammogram will be mailed directly to the patient.

RECOMMENDATION:
Screening mammogram in one year. (Code:Q3-W-BC3)

BI-RADS CATEGORY  1: Negative.

## 2022-03-02 DIAGNOSIS — R0609 Other forms of dyspnea: Secondary | ICD-10-CM | POA: Diagnosis not present

## 2022-03-02 DIAGNOSIS — R55 Syncope and collapse: Secondary | ICD-10-CM | POA: Diagnosis not present

## 2022-03-02 DIAGNOSIS — R42 Dizziness and giddiness: Secondary | ICD-10-CM | POA: Diagnosis not present

## 2022-03-04 DIAGNOSIS — R55 Syncope and collapse: Secondary | ICD-10-CM | POA: Diagnosis not present

## 2022-03-12 DIAGNOSIS — R55 Syncope and collapse: Secondary | ICD-10-CM | POA: Diagnosis not present

## 2022-03-17 DIAGNOSIS — E782 Mixed hyperlipidemia: Secondary | ICD-10-CM | POA: Diagnosis not present

## 2022-04-02 DIAGNOSIS — L538 Other specified erythematous conditions: Secondary | ICD-10-CM | POA: Diagnosis not present

## 2022-04-02 DIAGNOSIS — D2262 Melanocytic nevi of left upper limb, including shoulder: Secondary | ICD-10-CM | POA: Diagnosis not present

## 2022-04-02 DIAGNOSIS — L820 Inflamed seborrheic keratosis: Secondary | ICD-10-CM | POA: Diagnosis not present

## 2022-04-02 DIAGNOSIS — D225 Melanocytic nevi of trunk: Secondary | ICD-10-CM | POA: Diagnosis not present

## 2022-04-02 DIAGNOSIS — L718 Other rosacea: Secondary | ICD-10-CM | POA: Diagnosis not present

## 2022-04-02 DIAGNOSIS — D2261 Melanocytic nevi of right upper limb, including shoulder: Secondary | ICD-10-CM | POA: Diagnosis not present

## 2022-04-02 DIAGNOSIS — L298 Other pruritus: Secondary | ICD-10-CM | POA: Diagnosis not present

## 2022-04-02 DIAGNOSIS — L218 Other seborrheic dermatitis: Secondary | ICD-10-CM | POA: Diagnosis not present

## 2022-04-02 DIAGNOSIS — L570 Actinic keratosis: Secondary | ICD-10-CM | POA: Diagnosis not present

## 2022-04-02 DIAGNOSIS — X32XXXA Exposure to sunlight, initial encounter: Secondary | ICD-10-CM | POA: Diagnosis not present

## 2022-04-02 DIAGNOSIS — D2272 Melanocytic nevi of left lower limb, including hip: Secondary | ICD-10-CM | POA: Diagnosis not present

## 2022-05-07 DIAGNOSIS — I200 Unstable angina: Secondary | ICD-10-CM | POA: Diagnosis not present

## 2022-05-07 DIAGNOSIS — M109 Gout, unspecified: Secondary | ICD-10-CM | POA: Diagnosis not present

## 2022-05-12 ENCOUNTER — Other Ambulatory Visit: Payer: Self-pay | Admitting: Physician Assistant

## 2022-05-12 DIAGNOSIS — I4589 Other specified conduction disorders: Secondary | ICD-10-CM

## 2022-05-12 DIAGNOSIS — R6889 Other general symptoms and signs: Secondary | ICD-10-CM | POA: Diagnosis not present

## 2022-05-12 DIAGNOSIS — R001 Bradycardia, unspecified: Secondary | ICD-10-CM | POA: Diagnosis not present

## 2022-05-12 DIAGNOSIS — R0609 Other forms of dyspnea: Secondary | ICD-10-CM

## 2022-05-12 DIAGNOSIS — E782 Mixed hyperlipidemia: Secondary | ICD-10-CM

## 2022-05-12 DIAGNOSIS — Z8249 Family history of ischemic heart disease and other diseases of the circulatory system: Secondary | ICD-10-CM

## 2022-05-12 DIAGNOSIS — Z23 Encounter for immunization: Secondary | ICD-10-CM | POA: Diagnosis not present

## 2022-05-20 ENCOUNTER — Telehealth (HOSPITAL_COMMUNITY): Payer: Self-pay | Admitting: Emergency Medicine

## 2022-05-20 ENCOUNTER — Encounter (HOSPITAL_COMMUNITY): Payer: Self-pay

## 2022-05-20 DIAGNOSIS — R079 Chest pain, unspecified: Secondary | ICD-10-CM

## 2022-05-20 MED ORDER — IVABRADINE HCL 5 MG PO TABS: 15.0000 mg | ORAL_TABLET | Freq: Once | ORAL | 0 refills | Status: AC

## 2022-05-20 MED ORDER — METOPROLOL TARTRATE 100 MG PO TABS: 100.0000 mg | ORAL_TABLET | Freq: Once | ORAL | 0 refills | Status: DC

## 2022-05-20 NOTE — Telephone Encounter (Signed)
Reaching out to patient to offer assistance regarding upcoming cardiac imaging study; pt verbalizes understanding of appt date/time, parking situation and where to check in, pre-test NPO status and medications ordered, and verified current allergies; name and call back number provided for further questions should they arise Marchia Bond RN Navigator Cardiac Imaging [facility] [phone] office [phone] cell  Arrival [address] [facility] Difficult iv issues 115m metoprolol + 148mivabradine Aware contrast / nitro

## 2022-05-21 ENCOUNTER — Ambulatory Visit
Admission: RE | Admit: 2022-05-21 | Discharge: 2022-05-21 | Disposition: A | Discharge status: Home / Self Care | Payer: PPO | Source: Ambulatory Visit | Attending: Physician Assistant | Admitting: Physician Assistant

## 2022-05-21 DIAGNOSIS — R0609 Other forms of dyspnea: Secondary | ICD-10-CM | POA: Insufficient documentation

## 2022-05-21 DIAGNOSIS — Z8249 Family history of ischemic heart disease and other diseases of the circulatory system: Secondary | ICD-10-CM | POA: Diagnosis not present

## 2022-05-21 DIAGNOSIS — E782 Mixed hyperlipidemia: Secondary | ICD-10-CM | POA: Insufficient documentation

## 2022-05-21 DIAGNOSIS — I2510 Atherosclerotic heart disease of native coronary artery without angina pectoris: Secondary | ICD-10-CM | POA: Diagnosis not present

## 2022-05-21 DIAGNOSIS — R6889 Other general symptoms and signs: Secondary | ICD-10-CM | POA: Diagnosis not present

## 2022-05-21 DIAGNOSIS — I4589 Other specified conduction disorders: Secondary | ICD-10-CM | POA: Insufficient documentation

## 2022-05-21 MED ORDER — IOHEXOL 350 MG/ML SOLN
100.0000 mL | Freq: Once | INTRAVENOUS | Status: AC | PRN
  Administered 2022-05-21: 100 mL via INTRAVENOUS

## 2022-05-21 MED ORDER — NITROGLYCERIN 0.4 MG SL SUBL
0.8000 mg | SUBLINGUAL_TABLET | Freq: Once | SUBLINGUAL | Status: AC
  Administered 2022-05-21: 0.8 mg via SUBLINGUAL

## 2022-05-21 NOTE — Progress Notes (Signed)
Patient tolerated CT well. Drank water after. Vital signs stable encourage to drink water throughout day.Reasons explained and verbalized understanding. Ambulated steady gait.  

## 2022-05-27 DIAGNOSIS — H903 Sensorineural hearing loss, bilateral: Secondary | ICD-10-CM | POA: Diagnosis not present

## 2022-05-27 DIAGNOSIS — H8111 Benign paroxysmal vertigo, right ear: Secondary | ICD-10-CM | POA: Diagnosis not present

## 2022-06-04 ENCOUNTER — Ambulatory Visit: Admission: RE | Admit: 2022-06-04 | Payer: PPO | Source: Ambulatory Visit

## 2022-06-04 DIAGNOSIS — R42 Dizziness and giddiness: Secondary | ICD-10-CM | POA: Diagnosis not present

## 2022-06-09 DIAGNOSIS — H8111 Benign paroxysmal vertigo, right ear: Secondary | ICD-10-CM | POA: Diagnosis not present

## 2022-06-09 DIAGNOSIS — E538 Deficiency of other specified B group vitamins: Secondary | ICD-10-CM | POA: Diagnosis not present

## 2022-06-09 DIAGNOSIS — E782 Mixed hyperlipidemia: Secondary | ICD-10-CM | POA: Diagnosis not present

## 2022-06-18 DIAGNOSIS — I4589 Other specified conduction disorders: Secondary | ICD-10-CM | POA: Diagnosis not present

## 2022-06-25 DIAGNOSIS — Z0000 Encounter for general adult medical examination without abnormal findings: Secondary | ICD-10-CM | POA: Diagnosis not present

## 2022-06-25 DIAGNOSIS — I4439 Other atrioventricular block: Secondary | ICD-10-CM | POA: Diagnosis not present

## 2022-06-25 DIAGNOSIS — E538 Deficiency of other specified B group vitamins: Secondary | ICD-10-CM | POA: Diagnosis not present

## 2022-06-25 DIAGNOSIS — E782 Mixed hyperlipidemia: Secondary | ICD-10-CM | POA: Diagnosis not present

## 2022-07-07 DIAGNOSIS — I4439 Other atrioventricular block: Secondary | ICD-10-CM | POA: Diagnosis not present

## 2022-07-07 DIAGNOSIS — I442 Atrioventricular block, complete: Secondary | ICD-10-CM | POA: Diagnosis not present

## 2022-07-07 DIAGNOSIS — H8111 Benign paroxysmal vertigo, right ear: Secondary | ICD-10-CM | POA: Diagnosis not present

## 2022-07-07 DIAGNOSIS — E782 Mixed hyperlipidemia: Secondary | ICD-10-CM | POA: Diagnosis not present

## 2022-07-16 ENCOUNTER — Encounter: Payer: Self-pay | Admitting: Cardiology

## 2022-07-16 ENCOUNTER — Encounter
Admission: RE | Disposition: A | Discharge status: Home / Self Care | Payer: Self-pay | Source: Home / Self Care | Attending: Cardiology

## 2022-07-16 ENCOUNTER — Observation Stay
Admission: RE | Admit: 2022-07-16 | Discharge: 2022-07-17 | Disposition: A | Discharge status: Home / Self Care | Payer: PPO | Attending: Cardiology | Admitting: Cardiology

## 2022-07-16 ENCOUNTER — Other Ambulatory Visit: Payer: Self-pay

## 2022-07-16 DIAGNOSIS — Z006 Encounter for examination for normal comparison and control in clinical research program: Secondary | ICD-10-CM | POA: Insufficient documentation

## 2022-07-16 DIAGNOSIS — I442 Atrioventricular block, complete: Secondary | ICD-10-CM | POA: Diagnosis not present

## 2022-07-16 DIAGNOSIS — Z87891 Personal history of nicotine dependence: Secondary | ICD-10-CM | POA: Insufficient documentation

## 2022-07-16 DIAGNOSIS — I440 Atrioventricular block, first degree: Secondary | ICD-10-CM

## 2022-07-16 HISTORY — DX: Diverticulitis of intestine, part unspecified, without perforation or abscess without bleeding: K57.92

## 2022-07-16 HISTORY — DX: Dizziness and giddiness: R42

## 2022-07-16 HISTORY — DX: Hyperlipidemia, unspecified: E78.5

## 2022-07-16 HISTORY — DX: Nontoxic single thyroid nodule: E04.1

## 2022-07-16 HISTORY — DX: Unspecified atrioventricular block: I44.30

## 2022-07-16 HISTORY — PX: PACEMAKER LEADLESS INSERTION: EP1219

## 2022-07-16 SURGERY — PACEMAKER LEADLESS INSERTION
Anesthesia: Moderate Sedation

## 2022-07-16 MED ORDER — SODIUM CHLORIDE 0.9% FLUSH: 3.0000 mL | Freq: Two times a day (BID) | INTRAVENOUS | Status: DC

## 2022-07-16 MED ORDER — FENTANYL CITRATE (PF) 100 MCG/2ML IJ SOLN
INTRAMUSCULAR | Status: AC
  Filled 2022-07-16: qty 2

## 2022-07-16 MED ORDER — SODIUM CHLORIDE 0.9% FLUSH: 3.0000 mL | INTRAVENOUS | Status: DC | PRN

## 2022-07-16 MED ORDER — IOHEXOL 300 MG/ML  SOLN
INTRAMUSCULAR | Status: DC | PRN
  Administered 2022-07-16: 10 mL

## 2022-07-16 MED ORDER — MIDAZOLAM HCL 2 MG/2ML IJ SOLN
INTRAMUSCULAR | Status: DC | PRN
  Administered 2022-07-16: 1 mg via INTRAVENOUS

## 2022-07-16 MED ORDER — ONDANSETRON HCL 4 MG/2ML IJ SOLN: 4.0000 mg | Freq: Four times a day (QID) | INTRAMUSCULAR | Status: DC | PRN

## 2022-07-16 MED ORDER — SODIUM CHLORIDE 0.9% FLUSH
3.0000 mL | Freq: Two times a day (BID) | INTRAVENOUS | Status: DC
  Administered 2022-07-16 (×2): 3 mL via INTRAVENOUS

## 2022-07-16 MED ORDER — FENTANYL CITRATE (PF) 100 MCG/2ML IJ SOLN
INTRAMUSCULAR | Status: DC | PRN
  Administered 2022-07-16: 25 ug via INTRAVENOUS

## 2022-07-16 MED ORDER — HEPARIN SODIUM (PORCINE) 1000 UNIT/ML IJ SOLN
INTRAMUSCULAR | Status: DC | PRN
  Administered 2022-07-16: 4000 [IU] via INTRAVENOUS

## 2022-07-16 MED ORDER — MIDAZOLAM HCL 2 MG/2ML IJ SOLN
INTRAMUSCULAR | Status: AC
  Filled 2022-07-16: qty 2

## 2022-07-16 MED ORDER — SODIUM CHLORIDE 0.9 % IV SOLN: 250.0000 mL | INTRAVENOUS | Status: DC | PRN

## 2022-07-16 MED ORDER — ACETAMINOPHEN 325 MG PO TABS: 650.0000 mg | ORAL_TABLET | ORAL | Status: DC | PRN

## 2022-07-16 MED ORDER — HEPARIN SODIUM (PORCINE) 1000 UNIT/ML IJ SOLN
INTRAMUSCULAR | Status: AC
  Filled 2022-07-16: qty 10

## 2022-07-16 MED ORDER — HEPARIN (PORCINE) IN NACL 2000-0.9 UNIT/L-% IV SOLN
INTRAVENOUS | Status: DC | PRN
  Administered 2022-07-16: 1000 mL

## 2022-07-16 MED ORDER — AZELASTINE HCL 0.1 % NA SOLN
1.0000 | Freq: Two times a day (BID) | NASAL | Status: DC
  Filled 2022-07-16: qty 30

## 2022-07-16 MED ORDER — AZELASTINE HCL 0.1 % NA SOLN: 1.0000 | Freq: Two times a day (BID) | NASAL | Status: DC

## 2022-07-16 MED ORDER — ALPRAZOLAM 0.5 MG PO TABS
0.5000 mg | ORAL_TABLET | Freq: Every evening | ORAL | Status: DC | PRN
  Administered 2022-07-16: 0.5 mg via ORAL
  Filled 2022-07-16: qty 1

## 2022-07-16 MED ORDER — OYSTER SHELL CALCIUM/D3 500-5 MG-MCG PO TABS
1.0000 | ORAL_TABLET | Freq: Every day | ORAL | Status: DC
  Administered 2022-07-17: 1 via ORAL
  Filled 2022-07-16: qty 1

## 2022-07-16 MED ORDER — SODIUM CHLORIDE 0.9 % IV SOLN: INTRAVENOUS | Status: DC

## 2022-07-16 SURGICAL SUPPLY — 16 items
DILATOR VESSEL 38 20CM 12FR (INTRODUCER) IMPLANT
DILATOR VESSEL 38 20CM 14FR (INTRODUCER) IMPLANT
DILATOR VESSEL 38 20CM 18FR (INTRODUCER) IMPLANT
DILATOR VESSEL 38 20CM 8FR (INTRODUCER) IMPLANT
KIT SYRINGE INJ CVI SPIKEX1 (MISCELLANEOUS) IMPLANT
MICRA INTRODUCER SHEATH (SHEATH) ×1
NDL PERC 18GX7CM (NEEDLE) IMPLANT
NEEDLE PERC 18GX7CM (NEEDLE) ×1 IMPLANT
PACEMAKER LEADLESS AV2 MICRA (Pacemaker) IMPLANT
PACK CARDIAC CATH (CUSTOM PROCEDURE TRAY) ×1 IMPLANT
PAD ELECT DEFIB RADIOL ZOLL (MISCELLANEOUS) IMPLANT
PANNUS RETENTION SYSTEM 2 PAD (MISCELLANEOUS) IMPLANT
SHEATH AVANTI 7FRX11 (SHEATH) IMPLANT
SHEATH INTRODUCER MICRA (SHEATH) IMPLANT
SUT SILK 0 FSL (SUTURE) IMPLANT
WIRE AMPLATZ SS-J .035X180CM (WIRE) IMPLANT

## 2022-07-16 NOTE — Discharge Summary (Signed)
Discharge Summary      Patient ID: Natalie Wood MRN: [id number] DOB/AGE: Aug 03, 1945 77 y.o.  Admit date: 07/16/2022 Discharge date: 07/17/2022  Primary Discharge Diagnosis high grade AV block Secondary Discharge Diagnosis s/p pacemaker  Significant Diagnostic Studies: Micra Leadless Pacemaker Implantation   Technical Details The right groin was prepped and draped in usual sterile manner.  Anesthesia was obtained 1% lidocaine locally.  Access was obtained to the right femoral vein with ultrasound guidance.  A 7 French sheath was introduced right femoral vein.  The right femoral vein was dilated with 8, 12, 14, 18 French dilators.  Hydrophilic 23 French sheath was introduced right femoral vein.  Micra AV 2 leadless pacemaker ([employer]onic [place] AV2 [facility] [id number]) was positioned in the right ventricular apical septum with the delivery catheter under fluoroscopic guidance.  Optimal positioning confirmed with contrast injection in RAO and LAO views.  Optimal implantation was confirmed post implant interrogation and to pull and hold test.  The tether was cut and delivery catheter removed.  Pneumostasis was achieved with figure-of-eight 0 silk suture tie and manual compression.  Patient received 4000 units of heparin.  He received 1 mg of Versed and 25 mcg of fentanyl.  No periprocedural complications. Postprocedural interrogation revealed lead impedance 1200 ohms, R wave 9.6 mV, threshold 0.25 V at 0.24 ms pulse width. Estimated blood loss <50 mL.   During this procedure medications were administered to achieve and maintain moderate conscious sedation while the patient's heart rate, blood pressure, and oxygen saturation were continuously monitored and I was present face-to-face 100% of this time.    Consults: none  Hospital Course: The patient was brought to the cardiac cath lab and underwent [employer] Micra leadless pacemaker placement with Dr. Marcina Millard on 07/16/2022. The patient  tolerated with procedure well without complications. The device was interrogated post procedure and is functioning appropriately with good threshold and lead impedance. On 07/17/2022 the incision site was examined and found to be without active bleeding, significant erythema, tenderness to palpation, or apparent aneurysm. Figure of eight suture was removed at the beside by me and was covered with dry gauze and tegaderm dressing. The patient was given care instructions and warning symptoms to look out at the incision site and will follow up in office in 1 week, or sooner if needed.     Discharge Exam: Blood pressure 133/77, pulse 86, temperature 98.1 F (36.7 C), resp. rate 18, height  (1.575 m), weight 80.9 kg, SpO2 92 %.   PHYSICAL EXAM General: pleasant caucasian female, well nourished, in no acute distress.  HEENT:  Normocephalic and atraumatic. Neck:   No JVD.  Lungs: Normal respiratory effort on room air.  Clear to ascultation bilaterally. Heart: HRRR . Normal S1 and S2 without gallops or murmurs. Abdomen: non-distended appearing.  Msk: Normal strength and tone for age. Extremities: No clubbing, cyanosis, edema. R groin with trace ecchymosis medially to incision, figure of 8 suture removed by me and covered with clean gauze and tegaderm. No apparent aneurysm, n oactive bleeding or significant tenderness to palpation Neuro: Alert and oriented x3 Psych:  mood appropriate for situation.    Labs:  No results found for: "WBC", "HGB", "HCT", "MCV", "PLT"  Recent Labs  Lab 07/17/22 0432  NA 138  K 4.1  CL 105  CO2 27  BUN 22  CREATININE 0.70  CALCIUM 8.7*  GLUCOSE 112*    EKG: NSR 70s  FOLLOW UP PLANS AND APPOINTMENTS  Allergies as of 07/17/2022  Reactions   Hydrocodone Nausea And Vomiting        Medication List     STOP taking these medications    Ambien 5 MG tablet Generic drug: zolpidem   metoprolol tartrate 100 MG tablet Commonly known as: LOPRESSOR    omeprazole 20 MG capsule Commonly known as: PRILOSEC       TAKE these medications    acetaminophen 325 MG tablet Commonly known as: TYLENOL Take 650 mg by mouth every 6 (six) hours as needed for moderate pain.   ALPRAZolam 0.5 MG tablet Commonly known as: XANAX Take 0.5 mg by mouth at bedtime as needed for anxiety. 0.5 to 1 tab   aspirin EC 81 MG tablet Take 81 mg by mouth daily.   Calcium + D + K 750-500-40 MG-UNT-MCG Tabs Generic drug: Calcium-Vitamin D-Vitamin K Take 1 tablet by mouth daily.   docusate sodium 100 MG capsule Commonly known as: COLACE Take 100 mg by mouth daily.   hydrochlorothiazide 25 MG tablet Commonly known as: HYDRODIURIL Take 25 mg by mouth daily.   loratadine 10 MG tablet Commonly known as: CLARITIN Take 10 mg by mouth daily as needed for allergies.   lovastatin 40 MG tablet Commonly known as: MEVACOR Take 40 mg by mouth at bedtime.   meclizine 25 MG tablet Commonly known as: ANTIVERT Take 25 mg by mouth 2 (two) times daily as needed for dizziness.   multivitamin tablet Take 1 tablet by mouth daily.   Vitamin B-12 2500 MCG Subl Place 2,500 mcg under the tongue 3 (three) times a week.   Vitamin D-1000 Max St 25 MCG (1000 UT) tablet Generic drug: Cholecalciferol Take 3,000 Units by mouth daily.         PLEASE BRING ALL MEDICATIONS WITH YOU TO FOLLOW UP APPOINTMENTS  This patient's plan of care was discussed and created with Dr. Darrold Junker and he is in agreement.    Time spent with patient: >30 mins Signed:  Rebeca Allegra PA-C 07/17/2022, 8:26 AM [facility] Card[facility]

## 2022-07-17 DIAGNOSIS — I441 Atrioventricular block, second degree: Secondary | ICD-10-CM | POA: Diagnosis not present

## 2022-07-17 DIAGNOSIS — I442 Atrioventricular block, complete: Secondary | ICD-10-CM | POA: Diagnosis not present

## 2022-07-17 LAB — BASIC METABOLIC PANEL
Anion gap: 6 (ref 5–15)
BUN: 22 mg/dL (ref 8–23)
CO2: 27 mmol/L (ref 22–32)
Calcium: 8.7 mg/dL — ABNORMAL LOW (ref 8.9–10.3)
Chloride: 105 mmol/L (ref 98–111)
Creatinine, Ser: 0.7 mg/dL (ref 0.44–1.00)
GFR, Estimated: >60 mL/min (ref >60)
Glucose, Bld: 112 mg/dL — ABNORMAL HIGH (ref 70–99)
Potassium: 4.1 mmol/L (ref 3.5–5.1)
Sodium: 138 mmol/L (ref 135–145)

## 2022-07-17 NOTE — Discharge Instructions (Signed)
Please avoid showering until tomorrow (Saturday) and do not submerge yourself in water for the next week. If your bandage gets wet or starts to fall off, replace it with another piece of gauze and the Tegaderm (clear bandage I provided). You should try to keep it covered for a week. You will follow up with Dr. Darrold Junker in the office in about 1 week. If you have bleeding from the site, lie down and apply firm pressure for 20 minutes, if it continues to bleed, call our office [phone]). Avoid heavy lifting, squatting (other than sitting) and strenuous activity for 1 week. You will get a call from [employer] to set up your pacemaker and the [employer] device. You do not need to bring this device with you to appointments. It is used to monitor your pacemaker from home.

## 2022-07-24 DIAGNOSIS — Z950 Presence of cardiac pacemaker: Secondary | ICD-10-CM | POA: Diagnosis not present

## 2022-07-24 DIAGNOSIS — I4891 Unspecified atrial fibrillation: Secondary | ICD-10-CM | POA: Diagnosis not present

## 2022-07-27 DIAGNOSIS — Z9842 Cataract extraction status, left eye: Secondary | ICD-10-CM | POA: Diagnosis not present

## 2022-07-27 DIAGNOSIS — H52223 Regular astigmatism, bilateral: Secondary | ICD-10-CM | POA: Diagnosis not present

## 2022-07-27 DIAGNOSIS — Z9841 Cataract extraction status, right eye: Secondary | ICD-10-CM | POA: Diagnosis not present

## 2022-09-29 DIAGNOSIS — Z1331 Encounter for screening for depression: Secondary | ICD-10-CM | POA: Diagnosis not present

## 2022-09-29 DIAGNOSIS — Z124 Encounter for screening for malignant neoplasm of cervix: Secondary | ICD-10-CM | POA: Diagnosis not present

## 2022-09-29 DIAGNOSIS — Z01419 Encounter for gynecological examination (general) (routine) without abnormal findings: Secondary | ICD-10-CM | POA: Diagnosis not present

## 2022-10-28 DIAGNOSIS — I4439 Other atrioventricular block: Secondary | ICD-10-CM | POA: Diagnosis not present

## 2022-10-28 DIAGNOSIS — Z8249 Family history of ischemic heart disease and other diseases of the circulatory system: Secondary | ICD-10-CM | POA: Diagnosis not present

## 2022-11-23 DIAGNOSIS — E782 Mixed hyperlipidemia: Secondary | ICD-10-CM | POA: Diagnosis not present

## 2022-11-23 DIAGNOSIS — I4439 Other atrioventricular block: Secondary | ICD-10-CM | POA: Diagnosis not present

## 2022-12-07 ENCOUNTER — Other Ambulatory Visit: Payer: Self-pay | Admitting: Internal Medicine

## 2022-12-07 DIAGNOSIS — Z1231 Encounter for screening mammogram for malignant neoplasm of breast: Secondary | ICD-10-CM

## 2023-01-08 DIAGNOSIS — E782 Mixed hyperlipidemia: Secondary | ICD-10-CM | POA: Diagnosis not present

## 2023-01-08 DIAGNOSIS — E538 Deficiency of other specified B group vitamins: Secondary | ICD-10-CM | POA: Diagnosis not present

## 2023-01-15 DIAGNOSIS — R739 Hyperglycemia, unspecified: Secondary | ICD-10-CM | POA: Diagnosis not present

## 2023-01-15 DIAGNOSIS — E782 Mixed hyperlipidemia: Secondary | ICD-10-CM | POA: Diagnosis not present

## 2023-01-15 DIAGNOSIS — I4439 Other atrioventricular block: Secondary | ICD-10-CM | POA: Diagnosis not present

## 2023-01-15 DIAGNOSIS — E538 Deficiency of other specified B group vitamins: Secondary | ICD-10-CM | POA: Diagnosis not present

## 2023-01-28 ENCOUNTER — Ambulatory Visit
Admission: RE | Admit: 2023-01-28 | Discharge: 2023-01-28 | Disposition: A | Discharge status: Home / Self Care | Payer: PPO | Source: Ambulatory Visit | Attending: Internal Medicine | Admitting: Internal Medicine

## 2023-01-28 DIAGNOSIS — Z1231 Encounter for screening mammogram for malignant neoplasm of breast: Secondary | ICD-10-CM | POA: Diagnosis not present

## 2023-02-03 ENCOUNTER — Other Ambulatory Visit: Payer: Self-pay | Admitting: Internal Medicine

## 2023-02-03 DIAGNOSIS — R928 Other abnormal and inconclusive findings on diagnostic imaging of breast: Secondary | ICD-10-CM

## 2023-02-05 ENCOUNTER — Ambulatory Visit
Admission: RE | Admit: 2023-02-05 | Discharge: 2023-02-05 | Disposition: A | Discharge status: Home / Self Care | Payer: PPO | Source: Ambulatory Visit | Attending: Internal Medicine | Admitting: Internal Medicine

## 2023-02-05 DIAGNOSIS — R928 Other abnormal and inconclusive findings on diagnostic imaging of breast: Secondary | ICD-10-CM

## 2023-02-05 DIAGNOSIS — R92322 Mammographic fibroglandular density, left breast: Secondary | ICD-10-CM | POA: Diagnosis not present

## 2023-03-17 DIAGNOSIS — G5602 Carpal tunnel syndrome, left upper limb: Secondary | ICD-10-CM | POA: Diagnosis not present

## 2023-12-24 ENCOUNTER — Other Ambulatory Visit: Payer: Self-pay | Admitting: Internal Medicine

## 2023-12-24 DIAGNOSIS — Z1231 Encounter for screening mammogram for malignant neoplasm of breast: Secondary | ICD-10-CM

## 2024-01-31 ENCOUNTER — Ambulatory Visit
Admission: RE | Admit: 2024-01-31 | Discharge: 2024-01-31 | Disposition: A | Discharge status: Home / Self Care | Source: Ambulatory Visit | Attending: Internal Medicine | Admitting: Internal Medicine

## 2024-01-31 DIAGNOSIS — Z1231 Encounter for screening mammogram for malignant neoplasm of breast: Secondary | ICD-10-CM | POA: Insufficient documentation
